# Patient Record
Sex: Male | Born: 1995 | Race: White | Hispanic: No | Marital: Single | Smoking: Never smoker
Health system: Southern US, Community
[De-identification: ages and names within clinical notes are randomized; demographics above are authoritative.]

## PROBLEM LIST (undated history)

## (undated) DIAGNOSIS — F419 Anxiety disorder, unspecified: Secondary | ICD-10-CM

## (undated) DIAGNOSIS — F840 Autistic disorder: Secondary | ICD-10-CM

## (undated) DIAGNOSIS — R6250 Unspecified lack of expected normal physiological development in childhood: Secondary | ICD-10-CM

## (undated) DIAGNOSIS — R4689 Other symptoms and signs involving appearance and behavior: Secondary | ICD-10-CM

## (undated) DIAGNOSIS — F909 Attention-deficit hyperactivity disorder, unspecified type: Secondary | ICD-10-CM

## (undated) DIAGNOSIS — F819 Developmental disorder of scholastic skills, unspecified: Secondary | ICD-10-CM

## (undated) DIAGNOSIS — R4189 Other symptoms and signs involving cognitive functions and awareness: Secondary | ICD-10-CM

## (undated) DIAGNOSIS — I10 Essential (primary) hypertension: Secondary | ICD-10-CM

## (undated) HISTORY — DX: Autistic disorder: F84.0

## (undated) HISTORY — DX: Developmental disorder of scholastic skills, unspecified: F81.9

## (undated) HISTORY — DX: Anxiety disorder, unspecified: F41.9

## (undated) HISTORY — DX: Essential (primary) hypertension: I10

---
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
## 2019-07-12 ENCOUNTER — Encounter (HOSPITAL_COMMUNITY): Payer: Self-pay

## 2019-07-12 ENCOUNTER — Emergency Department (HOSPITAL_COMMUNITY)
Admission: EM | Admit: 2019-07-12 | Discharge: 2019-07-17 | Disposition: A | Discharge status: Home / Self Care | Payer: Medicaid Other | Attending: Emergency Medicine | Admitting: Emergency Medicine

## 2019-07-12 DIAGNOSIS — F919 Conduct disorder, unspecified: Secondary | ICD-10-CM | POA: Diagnosis not present

## 2019-07-12 DIAGNOSIS — F840 Autistic disorder: Secondary | ICD-10-CM | POA: Insufficient documentation

## 2019-07-12 DIAGNOSIS — Z79899 Other long term (current) drug therapy: Secondary | ICD-10-CM | POA: Diagnosis not present

## 2019-07-12 DIAGNOSIS — F909 Attention-deficit hyperactivity disorder, unspecified type: Secondary | ICD-10-CM | POA: Insufficient documentation

## 2019-07-12 DIAGNOSIS — R4689 Other symptoms and signs involving appearance and behavior: Secondary | ICD-10-CM

## 2019-07-12 HISTORY — DX: Autistic disorder: F84.0

## 2019-07-12 HISTORY — DX: Other symptoms and signs involving appearance and behavior: R46.89

## 2019-07-12 HISTORY — DX: Unspecified lack of expected normal physiological development in childhood: R62.50

## 2019-07-12 HISTORY — DX: Other symptoms and signs involving cognitive functions and awareness: R41.89

## 2019-07-12 LAB — COMPREHENSIVE METABOLIC PANEL
ALT: 81 U/L — ABNORMAL HIGH (ref 0–44)
AST: 48 U/L — ABNORMAL HIGH (ref 15–41)
Albumin: 5 g/dL (ref 3.5–5.0)
Alkaline Phosphatase: 65 U/L (ref 38–126)
Anion gap: 11 (ref 5–15)
BUN: 17 mg/dL (ref 6–20)
CO2: 24 mmol/L (ref 22–32)
Calcium: 9.8 mg/dL (ref 8.9–10.3)
Chloride: 105 mmol/L (ref 98–111)
Creatinine, Ser: 1.08 mg/dL (ref 0.61–1.24)
GFR calc Af Amer: >60 mL/min (ref >60)
GFR calc non Af Amer: >60 mL/min (ref >60)
Glucose, Bld: 102 mg/dL — ABNORMAL HIGH (ref 70–99)
Potassium: 3.9 mmol/L (ref 3.5–5.1)
Sodium: 140 mmol/L (ref 135–145)
Total Bilirubin: 0.4 mg/dL (ref 0.3–1.2)
Total Protein: 8.3 g/dL — ABNORMAL HIGH (ref 6.5–8.1)

## 2019-07-12 LAB — CBC
HCT: 45.9 % (ref 39.0–52.0)
Hemoglobin: 15.9 g/dL (ref 13.0–17.0)
MCH: 32.3 pg (ref 26.0–34.0)
MCHC: 34.6 g/dL (ref 30.0–36.0)
MCV: 93.3 fL (ref 80.0–100.0)
Platelets: 281 10*3/uL (ref 150–400)
RBC: 4.92 MIL/uL (ref 4.22–5.81)
RDW: 13.2 % (ref 11.5–15.5)
WBC: 8.1 10*3/uL (ref 4.0–10.5)
nRBC: 0 % (ref 0.0–0.2)

## 2019-07-12 LAB — URINALYSIS, ROUTINE W REFLEX MICROSCOPIC
Bilirubin Urine: NEGATIVE
Glucose, UA: NEGATIVE mg/dL
Hgb urine dipstick: NEGATIVE
Ketones, ur: NEGATIVE mg/dL
Leukocytes,Ua: NEGATIVE
Nitrite: NEGATIVE
Protein, ur: NEGATIVE mg/dL
Specific Gravity, Urine: 1.014 (ref 1.005–1.030)
pH: 7 (ref 5.0–8.0)

## 2019-07-12 LAB — RAPID URINE DRUG SCREEN, HOSP PERFORMED
Amphetamines: NOT DETECTED
Barbiturates: NOT DETECTED
Benzodiazepines: POSITIVE — AB
Cocaine: NOT DETECTED
Opiates: NOT DETECTED
Tetrahydrocannabinol: NOT DETECTED

## 2019-07-12 LAB — ETHANOL: Alcohol, Ethyl (B): <10 mg/dL (ref <10)

## 2019-07-12 LAB — ACETAMINOPHEN LEVEL: Acetaminophen (Tylenol), Serum: <10 ug/mL — ABNORMAL LOW (ref 10–30)

## 2019-07-12 LAB — SALICYLATE LEVEL: Salicylate Lvl: <7 mg/dL (ref 2.8–30.0)

## 2019-07-12 MED ORDER — ALPRAZOLAM 0.5 MG PO TABS
0.5000 mg | ORAL_TABLET | Freq: Every day | ORAL | Status: DC
  Administered 2019-07-12 – 2019-07-16 (×5): 0.5 mg via ORAL
  Filled 2019-07-12 (×6): qty 1

## 2019-07-12 MED ORDER — ONDANSETRON HCL 4 MG PO TABS
4.0000 mg | ORAL_TABLET | Freq: Three times a day (TID) | ORAL | Status: DC | PRN
  Administered 2019-07-15: 4 mg via ORAL
  Filled 2019-07-12: qty 1

## 2019-07-12 MED ORDER — ALUM & MAG HYDROXIDE-SIMETH 200-200-20 MG/5ML PO SUSP: 30.0000 mL | Freq: Four times a day (QID) | ORAL | Status: DC | PRN

## 2019-07-12 MED ORDER — PROPRANOLOL HCL ER BEADS 80 MG PO CP24
80.0000 mg | ORAL_CAPSULE | Freq: Every day | ORAL | Status: DC
  Administered 2019-07-15 (×2): 80 mg via ORAL
  Filled 2019-07-12 (×6): qty 1

## 2019-07-12 MED ORDER — ACETAMINOPHEN 325 MG PO TABS: 650.0000 mg | ORAL_TABLET | ORAL | Status: DC | PRN

## 2019-07-12 NOTE — ED Notes (Signed)
[facility] called and states pt is psych cleared.  
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
## 2019-07-12 NOTE — Progress Notes (Signed)
CSW in attempt to contact pts guardian without succuss. PH: [phone]. CSW left VM requesting call back. 2nd shift CSW will leave handoff for 1st shift social worker to follow up.   [facility]  Clinical Social Worker  Ph: [phone]
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
## 2019-07-12 NOTE — ED Notes (Signed)
Guardian phone number [phone]

## 2019-07-12 NOTE — ED Notes (Signed)
SW notified at this time regarding [facility] recommendation

## 2019-07-12 NOTE — [facility] Counselor (Signed)
Disposition: Per Letitia Libra, FNP patient does not meet in patient criteria. She recommends patient be referred to social work for placement.

## 2019-07-12 NOTE — Progress Notes (Signed)
Consult request has been received. CSW attempting to follow up at present time  Debbera Wolken M. Medora Roorda LCSWA Transitions of Care  Clinical Social Worker  Ph: [phone] 
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
## 2019-07-13 NOTE — ED Notes (Addendum)
Talked on the phone with patient's mother. Received permission to inform her that patient would not be discharged.

## 2019-07-14 NOTE — ED Notes (Signed)
Meal provided 

## 2019-07-14 NOTE — ED Notes (Signed)
Pt asks when he will be placed  Appears resigned that he will be here for a bit more time   "It takes awhile sometimes"

## 2019-07-14 NOTE — ED Notes (Signed)
Psych cleared   Awaiting placement by SW  Pt continues to have sitter due to autism and impulsivity

## 2019-07-14 NOTE — ED Notes (Signed)
Pt dropped Xanax 0.5 mg on floor   Pill retrieved and wasted in sink with Baldo Ash, RN witness  Call to [facility] spoke with pharm who reentered and med delivered to pt   His lights are dimmed and he is ready to sleep

## 2019-07-16 MED ORDER — PROPRANOLOL HCL ER 80 MG PO CP24
80.0000 mg | ORAL_CAPSULE | Freq: Every day | ORAL | Status: DC
  Administered 2019-07-16: 22:00:00 80 mg via ORAL
  Filled 2019-07-16 (×2): qty 1
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
## 2019-07-17 NOTE — Clinical Social Work Note (Signed)
Received call Ms Peter Bradshaw with DSS stating that she and guardian, Mr Pete Glatter, were coming to ED to talk about expectations of return home, after which pt would return home with guardian.  Meeting held.  Patient verbalized understanding.  D/c with Mr hemrick.  DSS will continue to follow.

## 2019-07-17 NOTE — ED Provider Notes (Signed)
Emergency Medicine Observation Re-evaluation Note  Peter Bradshaw is a 23 y.o. male, seen on rounds today.  Pt initially presented to the ED for complaints of V70.1 and Psychiatric Evaluation Currently, the patient is cooperative.  Physical Exam  BP 113/87 (BP Location: Left Arm)   Pulse 74   Temp 97.9 F (36.6 C) (Oral)   Resp 18   SpO2 96%  Physical Exam  ED Course / MDM  EKG:    I have reviewed the labs performed to date as well as medications administered while in observation.  Recent changes in the last 24 hours include none. Plan  Current plan is for discharge back to guardian. Patient was under IVC and has been rescinded.   Peter Rasmussen, MD 07/17/19 [phone]

## 2020-05-21 ENCOUNTER — Other Ambulatory Visit: Payer: Self-pay

## 2020-05-21 ENCOUNTER — Emergency Department (HOSPITAL_COMMUNITY)
Admission: EM | Admit: 2020-05-21 | Discharge: 2020-05-23 | Disposition: A | Discharge status: Home / Self Care | Payer: Medicaid Other | Attending: Emergency Medicine | Admitting: Emergency Medicine

## 2020-05-21 ENCOUNTER — Encounter (HOSPITAL_COMMUNITY): Payer: Self-pay

## 2020-05-21 DIAGNOSIS — I82220 Acute embolism and thrombosis of inferior vena cava: Secondary | ICD-10-CM | POA: Insufficient documentation

## 2020-05-21 DIAGNOSIS — F840 Autistic disorder: Secondary | ICD-10-CM | POA: Diagnosis not present

## 2020-05-21 DIAGNOSIS — Z20822 Contact with and (suspected) exposure to covid-19: Secondary | ICD-10-CM | POA: Diagnosis not present

## 2020-05-21 DIAGNOSIS — F909 Attention-deficit hyperactivity disorder, unspecified type: Secondary | ICD-10-CM | POA: Insufficient documentation

## 2020-05-21 DIAGNOSIS — Z046 Encounter for general psychiatric examination, requested by authority: Secondary | ICD-10-CM | POA: Diagnosis not present

## 2020-05-21 LAB — CBC WITH DIFFERENTIAL/PLATELET
Abs Immature Granulocytes: 0.08 10*3/uL — ABNORMAL HIGH (ref 0.00–0.07)
Basophils Absolute: 0.1 10*3/uL (ref 0.0–0.1)
Basophils Relative: 1 %
Eosinophils Absolute: 0.3 10*3/uL (ref 0.0–0.5)
Eosinophils Relative: 3 %
HCT: 45.4 % (ref 39.0–52.0)
Hemoglobin: 15.6 g/dL (ref 13.0–17.0)
Immature Granulocytes: 1 %
Lymphocytes Relative: 18 %
Lymphs Abs: 1.9 10*3/uL (ref 0.7–4.0)
MCH: 32.6 pg (ref 26.0–34.0)
MCHC: 34.4 g/dL (ref 30.0–36.0)
MCV: 94.8 fL (ref 80.0–100.0)
Monocytes Absolute: 1.1 10*3/uL — ABNORMAL HIGH (ref 0.1–1.0)
Monocytes Relative: 10 %
Neutro Abs: 7.5 10*3/uL (ref 1.7–7.7)
Neutrophils Relative %: 67 %
Platelets: 325 10*3/uL (ref 150–400)
RBC: 4.79 MIL/uL (ref 4.22–5.81)
RDW: 12.7 % (ref 11.5–15.5)
WBC: 11 10*3/uL — ABNORMAL HIGH (ref 4.0–10.5)
nRBC: 0 % (ref 0.0–0.2)

## 2020-05-21 LAB — COMPREHENSIVE METABOLIC PANEL
ALT: 62 U/L — ABNORMAL HIGH (ref 0–44)
AST: 39 U/L (ref 15–41)
Albumin: 4.9 g/dL (ref 3.5–5.0)
Alkaline Phosphatase: 56 U/L (ref 38–126)
Anion gap: 11 (ref 5–15)
BUN: 21 mg/dL — ABNORMAL HIGH (ref 6–20)
CO2: 23 mmol/L (ref 22–32)
Calcium: 9.9 mg/dL (ref 8.9–10.3)
Chloride: 103 mmol/L (ref 98–111)
Creatinine, Ser: 0.94 mg/dL (ref 0.61–1.24)
GFR calc Af Amer: >60 mL/min (ref >60)
GFR calc non Af Amer: >60 mL/min (ref >60)
Glucose, Bld: 108 mg/dL — ABNORMAL HIGH (ref 70–99)
Potassium: 3.9 mmol/L (ref 3.5–5.1)
Sodium: 137 mmol/L (ref 135–145)
Total Bilirubin: 0.5 mg/dL (ref 0.3–1.2)
Total Protein: 8.5 g/dL — ABNORMAL HIGH (ref 6.5–8.1)

## 2020-05-21 LAB — ETHANOL: Alcohol, Ethyl (B): <10 mg/dL (ref <10)

## 2020-05-21 LAB — SALICYLATE LEVEL: Salicylate Lvl: <7 mg/dL — ABNORMAL LOW (ref 7.0–30.0)

## 2020-05-21 LAB — RAPID URINE DRUG SCREEN, HOSP PERFORMED
Amphetamines: NOT DETECTED
Barbiturates: NOT DETECTED
Benzodiazepines: POSITIVE — AB
Cocaine: NOT DETECTED
Opiates: NOT DETECTED
Tetrahydrocannabinol: NOT DETECTED

## 2020-05-21 LAB — ACETAMINOPHEN LEVEL: Acetaminophen (Tylenol), Serum: <10 ug/mL — ABNORMAL LOW (ref 10–30)

## 2020-05-21 LAB — SARS CORONAVIRUS 2 BY RT PCR (HOSPITAL ORDER, PERFORMED IN ~~LOC~~ HOSPITAL LAB): SARS Coronavirus 2: NEGATIVE

## 2020-05-21 MED ORDER — ACETAMINOPHEN 325 MG PO TABS
650.0000 mg | ORAL_TABLET | ORAL | Status: DC | PRN
  Administered 2020-05-21: 650 mg via ORAL
  Filled 2020-05-21: qty 2

## 2020-05-21 MED ORDER — RISPERIDONE 1 MG PO TABS
0.5000 mg | ORAL_TABLET | Freq: Two times a day (BID) | ORAL | Status: DC
  Administered 2020-05-21 – 2020-05-22 (×3): 0.5 mg via ORAL
  Filled 2020-05-21 (×3): qty 1

## 2020-05-21 MED ORDER — ZOLPIDEM TARTRATE 5 MG PO TABS: 5.0000 mg | ORAL_TABLET | Freq: Every evening | ORAL | Status: DC | PRN

## 2020-05-21 MED ORDER — ONDANSETRON HCL 4 MG PO TABS
4.0000 mg | ORAL_TABLET | Freq: Three times a day (TID) | ORAL | Status: DC | PRN
  Administered 2020-05-21: 4 mg via ORAL
  Filled 2020-05-21: qty 1

## 2020-05-21 MED ORDER — ALUM & MAG HYDROXIDE-SIMETH 200-200-20 MG/5ML PO SUSP: 30.0000 mL | Freq: Four times a day (QID) | ORAL | Status: DC | PRN

## 2020-05-21 MED ORDER — ALPRAZOLAM 0.5 MG PO TABS
0.2500 mg | ORAL_TABLET | Freq: Three times a day (TID) | ORAL | Status: DC | PRN
  Administered 2020-05-21: 0.25 mg via ORAL
  Filled 2020-05-21: qty 1

## 2020-05-21 MED ORDER — PROPRANOLOL HCL ER 80 MG PO CP24
80.0000 mg | ORAL_CAPSULE | Freq: Every day | ORAL | Status: DC
  Administered 2020-05-21 – 2020-05-22 (×2): 80 mg via ORAL
  Filled 2020-05-21 (×6): qty 1
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
## 2020-05-22 NOTE — Progress Notes (Signed)
Patient ID: BRONISLAUS VERDELL, male   DOB: 04/08/1996, 24 y.o.   MRN: [id number]   I spoke to [facility] representative Danetta regarding patient. Per counselor, representative was suppose to call today to provide an update on patients plan of care. It was documented that Daneta was to research potential placement options and notify [facility] with updates. When I spoke to [place] today, she stated what she meant by that was that she would look into places that may be optional housing however, she added, " this is a process and will take time." She stated that patients case manager has been on vacation since July, 12th and will not return until Monday. She was advised that patient did not meet criteria for inpatient psychiatric hospitalization and had therefore been psychiatrically cleared meaning that it would be expected for him to be  discharged from the ED. She stated that as far as she knew, patients case manager was looking for alternative housing options due to patients aggressive behaviors towards his guardian but she had no updates. Stated today, there would be no alternative place that patient could go as there are no emergency  placements available. Stated that she would call patients guardian to see if he could return home. She was advised to notify [facility] once she has spoken to patients guardian to move forward with patients plan of care. Again, on a psychiatric standpoint, he is psychiatrically cleared.
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
## 2020-05-22 NOTE — ED Notes (Signed)
Pt speaking with TTS currently in room

## 2020-05-22 NOTE — ED Notes (Signed)
Breakfast tray given to pt - finger foods only. Pt currently up watching tv and eating. Pleasant and cooperative.

## 2020-05-22 NOTE — ED Notes (Signed)
Pt given ph to call guardian per pt request

## 2020-05-22 NOTE — Progress Notes (Signed)
Patient ID: KHIAN REMO, male   DOB: 05/04/1996, 24 y.o.   MRN: [id number]   I again spoke back to [facility] representative Danetta who stated she contacted patients guardian who stated that patient could not return. She stated," what exactly would you'll like [facility] to do? At this point, we have no place for him to go so the only thing that we can do is touch basis with you'll daily and update you when an alternative placement is found." She was again advised that once a patient is psychiatrically cleared, discharge is expected. She was advised that patient would not be able to stay in the ED until placement is found as she previously stated, this could be a process.  She stated that they would begin to search for placement. She was strongly encouraged to search for emergency placements as well. She was advised that social worker will be contacting her tomorrow to discuss patients plan of care.

## 2020-05-22 NOTE — Progress Notes (Signed)
Pt has been psychiatrically cleared. CSW left HIPAA compliant voice message with [facility] case manager, Danetta [phone]) requesting a return phone call to discuss disposition.   Wells Guiles, LCSW, LCAS Disposition CSW [facility] [phone]

## 2020-05-23 NOTE — ED Notes (Signed)
Spoke with EDP IVC paperwork rescinded and faxed to magistrate at this time.

## 2020-05-23 NOTE — Clinical Social Work Note (Signed)
Transition of Care [facility]) - Emergency Department Mini Assessment  Patient Details  Name: Peter Bradshaw MRN: [id number] Date of Birth: August 11, 1996  [facility] of [facility]) CM/SW Contact:    Ewing Schlein, LCSW Phone Number: 05/23/2020, 10:08 AM  Clinical Narrative: Patient is a 24 year old male with autism that was brought to the ED under IVC. CSW notified by [facility] the patient is psych cleared.  CSW called patient's [facility] case worker, Flossie Dibble, to explain the patient has been psych and medically cleared so he will need to return home while Shelly Coss continues to work on placement. Danetta verbalized understanding. CSW attempted to call patient's legal guardian, [name], but was only able to reach voicemail. CSW left HIPAA compliant voicemail regarding the patient returning home.  CSW called [place] PD and spoke with Cardell Peach regarding the patient needing to be picked up. Cardell Peach stated he dropped off the patient for his IVC and will pick him up today. CSW updated ED. TOC signing off.  ED Mini Assessment: What brought you to the Emergency Department? : IVC Barriers to Discharge: ED Awaiting Psych Clearance, ED Psych evaluation, ED Facility/Family Refusing to Allow Patient to Return Barrier interventions: TTS consult; [place] PD called to pick up patient Means of departure: Police Interventions which prevented an admission or readmission: Other (must enter comment) [facility] assessed patient and he is psych cleared)  Patient Contact and Communications Key Contact 1: [place] [place]) Key Contact 2: [place] PD Cardell Peach) Contact Date: 05/23/20    Call outcome: Patient will return home while Shelly Coss continues to work on placement  Admission diagnosis:  IVC Patient Active Problem List   Diagnosis Date Noted  . Autism spectrum disorder    PCP:  Elisabeth Most, FNP Pharmacy:   [facility] [place] - [address] [place] [address] [address] [place] [address] Phone: [phone] Fax: [phone]

## 2020-05-23 NOTE — ED Provider Notes (Signed)
Emergency Medicine Observation Re-evaluation Note  Peter Bradshaw is a 24 y.o. male, seen on rounds today.  Pt initially presented to the ED for complaints of V70.1 and Aggressive Behavior Currently, the patient is awaiting placement in the ED.  Physical Exam  BP 117/79 (BP Location: Left Arm)   Pulse 88   Temp 97.9 F (36.6 C) (Oral)   Resp 20   Ht 5\' 4"  (1.626 m)   Wt 97.5 kg   SpO2 96%   BMI 36.90 kg/m  Physical Exam  Calm and cooperative.  Even unlabored respirations.   ED Course / MDM  EKG:EKG Interpretation  Date/Time:  Wednesday May 21 2020 16:39:28 EDT Ventricular Rate:  95 PR Interval:  162 QRS Duration: 100 QT Interval:  354 QTC Calculation: 444 R Axis:   -18 Text Interpretation: Normal sinus rhythm Minimal voltage criteria for LVH, may be normal variant ( R in aVL ) ST elevation, consider early repolarization, pericarditis, or injury Abnormal ECG When compared with ECG of EARLIER SAME DATE No significant change was found Confirmed by [id number] (Dione Booze) on 05/21/2020 5:16:15 PM  Clinical Course as of May 23 726  Wed May 21, 2020  [id number] 24 year old male with history of autism lives at home with caregiver but has been increasingly more violent over the past few months.  He is currently under an IVC.  He is calm at the moment.  Getting screening labs and will get a TTS consult   [MB]    Clinical Course User Index [MB] 21, MD   I have reviewed the labs performed to date as well as medications administered while in observation.  Recent changes in the last 24 hours include N/A. Plan  Current plan is for placement from the ED. Patient is under full IVC at this time.  10:00 AM  Patient has been psych cleared after reevaluation this morning.  IVC rescinded.  Social worker consulted he was reached out to the patient's guardian. He will be discharged to their care this AM. They are on their way to pick up the patient. No aggressive behavior noted this AM.  Patient is calm and cooperative.    Terrilee Files, MD 05/23/20 1007

## 2020-05-23 NOTE — Discharge Instructions (Signed)
Please follow with your primary care doctor as soon as you are able.  Return to the emergency department with any new or suddenly worsening symptoms.

## 2020-07-09 ENCOUNTER — Ambulatory Visit: Payer: Medicaid Other | Admitting: Family Medicine

## 2020-07-09 ENCOUNTER — Encounter: Payer: Self-pay | Admitting: Family Medicine

## 2020-07-09 ENCOUNTER — Other Ambulatory Visit: Payer: Self-pay

## 2020-07-09 VITALS — BP 129/84 | HR 103 | Temp 96.7°F | Ht 64.0 in | Wt 217.8 lb

## 2020-07-09 DIAGNOSIS — F411 Generalized anxiety disorder: Secondary | ICD-10-CM

## 2020-07-09 DIAGNOSIS — F332 Major depressive disorder, recurrent severe without psychotic features: Secondary | ICD-10-CM | POA: Insufficient documentation

## 2020-07-09 MED ORDER — ESCITALOPRAM OXALATE 10 MG PO TABS: 10.0000 mg | ORAL_TABLET | Freq: Every day | ORAL | 2 refills | Status: DC

## 2020-07-09 NOTE — Progress Notes (Signed)
New Patient Office Visit  Assessment & Plan:  1-2. 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 Started patient on Lexapro 10 mg QD today. I did not refill Xanax as patient has already been without it for a week.  - escitalopram (LEXAPRO) 10 MG tablet; Take 1 tablet (10 mg total) by mouth daily.  Dispense: 30 tablet; Refill: 2   Follow-up: Return in about 6 weeks (around 08/20/2020) for anxiety/depression.   Deliah Boston, MSN, APRN, FNP-C [facility]  Subjective:  Patient ID: Peter Bradshaw, male    DOB: 01/09/1996  Age: 24 y.o. MRN: [id number]  Patient Care Team: Gwenlyn Fudge, FNP as PCP - General (Family Medicine)  CC:  Chief Complaint  Patient presents with  . New Patient (Initial Visit)    [facility]   . Establish Care  . Anxiety    Patient is currently only on xanax for anxiety.    HPI Peter Bradshaw presents to establish care. He is transferring care from [facility] due to a move.   He is accompanied by his guardian today, who he is okay with being present.  Patient has been taking Xanax 0.25 mg TID for anxiety. He is not on any other medications to treat anxiety. He ran out of medication a week ago.   GAD 7 : Generalized Anxiety Score 07/09/2020  Nervous, Anxious, on Edge 3  Control/stop worrying 3  Worry too much - different things 2  Trouble relaxing 3  Restless 3  Easily annoyed or irritable 2  Afraid - awful might happen 3  Total GAD 7 Score 19   Depression screen [facility] 2/9 07/09/2020 05/21/2020  Decreased Interest 3 0  Down, Depressed, Hopeless 2 0  PHQ - 2 Score 5 0  Altered sleeping 1 -  Tired, decreased energy 3 -  Change in appetite 3 -  Feeling bad or failure about yourself  2 -  Trouble concentrating 3 -  Moving slowly or fidgety/restless 2 -  Suicidal thoughts 2 -  PHQ-9 Score 21 -   Patient reports he has  thoughts he would be better off dead but denies any thoughts of harming himself.    Review of Systems  Constitutional: Negative for chills, fever, malaise/fatigue and weight loss.  HENT: Negative for congestion, ear discharge, ear pain, nosebleeds, sinus pain, sore throat and tinnitus.   Eyes: Negative for blurred vision, double vision, pain, discharge and redness.  Respiratory: Negative for cough, shortness of breath and wheezing.   Cardiovascular: Negative for chest pain, palpitations and leg swelling.  Gastrointestinal: Negative for abdominal pain, constipation, diarrhea, heartburn, nausea and vomiting.  Genitourinary: Negative for dysuria, frequency and urgency.  Musculoskeletal: Negative for myalgias.  Skin: Negative for rash.  Neurological: Negative for dizziness, seizures, weakness and headaches.  Psychiatric/Behavioral: Positive for depression. Negative for substance abuse and suicidal ideas. The patient is nervous/anxious.     Current Outpatient Medications:  .  ALPRAZolam (XANAX) 0.25 MG tablet, Take 0.25 mg by mouth 3 (three) times daily as needed., Disp: , Rfl:  .  Multiple Vitamins-Minerals (MULTIVITAMIN WITH MINERALS) tablet, Take 1 tablet by mouth daily., Disp: , Rfl:  .  propranolol ER (INDERAL LA) 80 MG 24 hr capsule, Take 80 mg by mouth daily., Disp: , Rfl:   No Known Allergies  Past Medical History:  Diagnosis Date  . ADHD (attention deficit hyperactivity disorder)   . Anxiety   .  Autism   . Autistic behavior   . Cognitive impairment   . Developmental delay   . Hypertension   . Mental developmental delay     History reviewed. No pertinent surgical history.  Family History  Problem Relation Age of Onset  . Depression Mother   . Obesity Father   . Heart Problems Father   . Depression Maternal Grandmother     Social History   Socioeconomic History  . Marital status: Single    Spouse name: Not on file  . Number of children: Not on file  . Years of  education: Not on file  . Highest education level: Not on file  Occupational History  . Not on file  Tobacco Use  . Smoking status: Never Smoker  . Smokeless tobacco: Never Used  Vaping Use  . Vaping Use: Never used  Substance and Sexual Activity  . Alcohol use: Never  . Drug use: Never  . Sexual activity: Never  Other Topics Concern  . Not on file  Social History Narrative  . Not on file   Social Determinants of Health   Financial Resource Strain:   . Difficulty of Paying Living Expenses: Not on file  Food Insecurity:   . Worried About Programme researcher, broadcasting/film/video in the Last Year: Not on file  . Ran Out of Food in the Last Year: Not on file  Transportation Needs:   . Lack of Transportation (Medical): Not on file  . Lack of Transportation (Non-Medical): Not on file  Physical Activity:   . Days of Exercise per Week: Not on file  . Minutes of Exercise per Session: Not on file  Stress:   . Feeling of Stress : Not on file  Social Connections:   . Frequency of Communication with Friends and Family: Not on file  . Frequency of Social Gatherings with Friends and Family: Not on file  . Attends Religious Services: Not on file  . Active Member of Clubs or Organizations: Not on file  . Attends Banker Meetings: Not on file  . Marital Status: Not on file  Intimate Partner Violence:   . Fear of Current or Ex-Partner: Not on file  . Emotionally Abused: Not on file  . Physically Abused: Not on file  . Sexually Abused: Not on file    Objective:   Today's Vitals: BP 129/84   Pulse (!) 103   Temp (!) 96.7 F (35.9 C) (Temporal)   Ht 5\' 4"  (1.626 m)   Wt 217 lb 12.8 oz (98.8 kg)   SpO2 96%   BMI 37.39 kg/m   Physical Exam Vitals reviewed.  Constitutional:      General: He is not in acute distress.    Appearance: Normal appearance. He is obese. He is not ill-appearing, toxic-appearing or diaphoretic.  HENT:     Head: Normocephalic and atraumatic.  Eyes:      General: No scleral icterus.       Right eye: No discharge.        Left eye: No discharge.     Conjunctiva/sclera: Conjunctivae normal.  Cardiovascular:     Rate and Rhythm: Normal rate and regular rhythm.     Heart sounds: Normal heart sounds. No murmur heard.  No friction rub. No gallop.   Pulmonary:     Effort: Pulmonary effort is normal. No respiratory distress.     Breath sounds: Normal breath sounds. No stridor. No wheezing, rhonchi or rales.  Abdominal:  General: Abdomen is flat. Bowel sounds are normal. There is no distension.     Palpations: Abdomen is soft. There is no mass.     Tenderness: There is no abdominal tenderness. There is no guarding or rebound.     Hernia: No hernia is present.  Musculoskeletal:        General: Normal range of motion.     Cervical back: Normal range of motion.  Skin:    General: Skin is warm and dry.  Neurological:     Mental Status: He is alert and oriented to person, place, and time. Mental status is at baseline.  Psychiatric:        Mood and Affect: Mood normal.        Behavior: Behavior normal.        Thought Content: Thought content normal.        Judgment: Judgment normal.

## 2020-07-15 ENCOUNTER — Encounter: Payer: Self-pay | Admitting: *Deleted

## 2020-08-19 ENCOUNTER — Encounter: Payer: Self-pay | Admitting: Nurse Practitioner

## 2020-08-19 ENCOUNTER — Ambulatory Visit (INDEPENDENT_AMBULATORY_CARE_PROVIDER_SITE_OTHER): Payer: Medicaid Other | Admitting: Nurse Practitioner

## 2020-08-19 ENCOUNTER — Other Ambulatory Visit: Payer: Self-pay

## 2020-08-19 VITALS — BP 137/87 | HR 109 | Temp 97.0°F | Ht 64.0 in | Wt 217.6 lb

## 2020-08-19 DIAGNOSIS — F332 Major depressive disorder, recurrent severe without psychotic features: Secondary | ICD-10-CM

## 2020-08-19 NOTE — Patient Instructions (Signed)
No changes to current medication therapy, patient;s anxiety and depression improved. Worsening anger issues, suggested  Psychiatry evaluation and cognitive behavioral therapy, follow up in 3 months.   Intermittent Explosive Disorder Intermittent explosive disorder (IED) is a mental health disorder in which a person has trouble controlling his or her angry impulses. A person with this disorder may repeatedly have sudden episodes of aggression (explosive rages). The rages are unplanned and too much for the situation. They can result in serious injury to people or animals, or property damage. IED may interfere with relationships, school, or work, and may cause legal or financial problems. Intermittent explosive disorder most commonly starts in childhood or the teenage years. It may go away after 10-20 years or may last for life. People with IED often have one or more other mental disorders, such as other impulse control disorders, depression, anxiety, or substance abuse. People with this disorder are also at higher risk for self-injury and suicide attempts. What are the causes? The cause of IED is not known. What increases the risk? You are more likely to develop this condition if:  You are male.  You were abused during childhood.  You have a family member who has IED, a different impulse control disorder, or other mental health issues. What are the signs or symptoms? Explosive rages may be verbal, physical, or both. Examples of verbal rages may include:  Temper tantrums after the age of 6 years.  Verbal arguments or fights.  Vicious name calling, cursing, or insulting others.  Road rage. Physical rages are directed at people, animals, or property. They may or may not cause injury or damage. Examples of physical aggression may include:  Pushing, slapping, punching, choking, or kicking.  Punching holes in walls, breaking furniture, or throwing items. Verbal or physical rages:  May  happen an average of twice a week for 3 months or longer. Rages causing physical injury or property damage may occur at least three times a year.  Usually last less than 30 minutes. People with IED may want to change their behavior but are often unable to monitor and control their outbursts by themselves. You may feel remorseful or embarrassed after a rage. In between rages, there may be no signs of anger, or you may be irritable. Before or during a rage, you may have one or more of the following sensations:  Tingling.  Shaking (tremors).  Pounding heart.  Chest tightness.  Head pressure.  Hearing an echo. How is this diagnosed? Intermittent explosive disorder is diagnosed through an assessment by your health care provider. He or she may ask about:  Your rages and how they affect your life.  Your moods, thoughts, and other behaviors.  Your medical history.  Your use of medicines or drugs. Your health care provider may do a physical exam and order lab tests or brain imaging tests. You may be evaluated by a mental health professional. How is this treated? This condition is treated by mental health specialists. Treatment may include:  Therapy. You may have therapy that: ? Focuses on your underlying feelings and reasons that lead to rages. ? Evaluates your triggers for rages and ways to prevent or control them (cognitive behavioral therapy, CBT). CBT in a group setting may be especially helpful.  Support groups. These can provide emotional support, advice, and guidance.  Medicine to help reduce the frequency and severity of explosive rages. There are several types of medicines that may be used, including certain antidepressants, mood stabilizers, anti-seizure medicines (  anticonvulsants), major tranquilizers (antipsychotics), and beta blockers. The most effective treatment is usually a combination of therapy, support groups, and medicine. You will also be treated for any other mental  health problems you have. Follow these instructions at home:   Take over-the-counter and prescription medicines only as told by your health care provider.  Do not start taking any new over-the-counter or prescription medicines before first getting approval from your health care provider.  Attend therapy and support groups as recommended.  Try to avoid situations and people that upset you.  Keep all follow-up visits as told by your health care provider. This is important. Contact a health care provider if you:  Have worsening symptoms.  Are not able to take your medicine as told. Get help right away if you have:  Thoughts about hurting yourself or someone else.  Seriously injured yourself or someone else. If you ever feel like you may hurt yourself or others, or have thoughts about taking your own life, get help right away. You can go to your nearest emergency department or call:  Your local emergency services (911 in the [place]  A suicide crisis helpline, such as the National Suicide Prevention Lifeline at 615-137-2662. This is open 24 hours a day. Summary  Intermittent explosive disorder (IED) is a mental health disorder in which a person has trouble controlling his or her angry impulses.  Intermittent explosive disorder most commonly starts in childhood or the teenage years.  People with IED may want to change their behavior but are often unable to monitor and control their outbursts by themselves.  The most effective treatment is usually a combination of therapy, support groups, and medicine. Treatment will also focus on any other mental health problems you have. This information is not intended to replace advice given to you by your health care provider. Make sure you discuss any questions you have with your health care provider. Document Revised: 09/16/2017 Document Reviewed: 08/11/2017 Elsevier Patient Education  2020 [employer] Generalized Anxiety Disorder,  Adult Generalized anxiety disorder (GAD) is a mental health disorder. People with this condition constantly worry about everyday events. Unlike normal anxiety, worry related to GAD is not triggered by a specific event. These worries also do not fade or get better with time. GAD interferes with life functions, including relationships, work, and school. GAD can vary from mild to severe. People with severe GAD can have intense waves of anxiety with physical symptoms (panic attacks). What are the causes? The exact cause of GAD is not known. What increases the risk? This condition is more likely to develop in:  Women.  People who have a family history of anxiety disorders.  People who are very shy.  People who experience very stressful life events, such as the death of a loved one.  People who have a very stressful family environment. What are the signs or symptoms? People with GAD often worry excessively about many things in their lives, such as their health and family. They may also be overly concerned about:  Doing well at work.  Being on time.  Natural disasters.  Friendships. Physical symptoms of GAD include:  Fatigue.  Muscle tension or having muscle twitches.  Trembling or feeling shaky.  Being easily startled.  Feeling like your heart is pounding or racing.  Feeling out of breath or like you cannot take a deep breath.  Having trouble falling asleep or staying asleep.  Sweating.  Nausea, diarrhea, or irritable bowel syndrome (IBS).  Headaches.  Trouble  concentrating or remembering facts.  Restlessness.  Irritability. How is this diagnosed? Your health care provider can diagnose GAD based on your symptoms and medical history. You will also have a physical exam. The health care provider will ask specific questions about your symptoms, including how severe they are, when they started, and if they come and go. Your health care provider may ask you about your use  of alcohol or drugs, including prescription medicines. Your health care provider may refer you to a mental health specialist for further evaluation. Your health care provider will do a thorough examination and may perform additional tests to rule out other possible causes of your symptoms. To be diagnosed with GAD, a person must have anxiety that:  Is out of his or her control.  Affects several different aspects of his or her life, such as work and relationships.  Causes distress that makes him or her unable to take part in normal activities.  Includes at least three physical symptoms of GAD, such as restlessness, fatigue, trouble concentrating, irritability, muscle tension, or sleep problems. Before your health care provider can confirm a diagnosis of GAD, these symptoms must be present more days than they are not, and they must last for six months or longer. How is this treated? The following therapies are usually used to treat GAD:  Medicine. Antidepressant medicine is usually prescribed for long-term daily control. Antianxiety medicines may be added in severe cases, especially when panic attacks occur.  Talk therapy (psychotherapy). Certain types of talk therapy can be helpful in treating GAD by providing support, education, and guidance. Options include: ? Cognitive behavioral therapy (CBT). People learn coping skills and techniques to ease their anxiety. They learn to identify unrealistic or negative thoughts and behaviors and to replace them with positive ones. ? Acceptance and commitment therapy (ACT). This treatment teaches people how to be mindful as a way to cope with unwanted thoughts and feelings. ? Biofeedback. This process trains you to manage your body's response (physiological response) through breathing techniques and relaxation methods. You will work with a therapist while machines are used to monitor your physical symptoms.  Stress management techniques. These include yoga,  meditation, and exercise. A mental health specialist can help determine which treatment is best for you. Some people see improvement with one type of therapy. However, other people require a combination of therapies. Follow these instructions at home:  Take over-the-counter and prescription medicines only as told by your health care provider.  Try to maintain a normal routine.  Try to anticipate stressful situations and allow extra time to manage them.  Practice any stress management or self-calming techniques as taught by your health care provider.  Do not punish yourself for setbacks or for not making progress.  Try to recognize your accomplishments, even if they are small.  Keep all follow-up visits as told by your health care provider. This is important. Contact a health care provider if:  Your symptoms do not get better.  Your symptoms get worse.  You have signs of depression, such as: ? A persistently sad, cranky, or irritable mood. ? Loss of enjoyment in activities that used to bring you joy. ? Change in weight or eating. ? Changes in sleeping habits. ? Avoiding friends or family members. ? Loss of energy for normal tasks. ? Feelings of guilt or worthlessness. Get help right away if:  You have serious thoughts about hurting yourself or others. If you ever feel like you may hurt yourself or others,  or have thoughts about taking your own life, get help right away. You can go to your nearest emergency department or call:  Your local emergency services (911 in the [place]  A suicide crisis helpline, such as the National Suicide Prevention Lifeline at (984)377-1537. This is open 24 hours a day. Summary  Generalized anxiety disorder (GAD) is a mental health disorder that involves worry that is not triggered by a specific event.  People with GAD often worry excessively about many things in their lives, such as their health and family.  GAD may cause physical symptoms such  as restlessness, trouble concentrating, sleep problems, frequent sweating, nausea, diarrhea, headaches, and trembling or muscle twitching.  A mental health specialist can help determine which treatment is best for you. Some people see improvement with one type of therapy. However, other people require a combination of therapies. This information is not intended to replace advice given to you by your health care provider. Make sure you discuss any questions you have with your health care provider. Document Revised: 09/16/2017 Document Reviewed: 08/24/2016 Elsevier Patient Education  2020 [employer] Living With Depression Everyone experiences occasional disappointment, sadness, and loss in their lives. When you are feeling down, blue, or sad for at least 2 weeks in a row, it may mean that you have depression. Depression can affect your thoughts and feelings, relationships, daily activities, and physical health. It is caused by changes in the way your brain functions. If you receive a diagnosis of depression, your health care provider will tell you which type of depression you have and what treatment options are available to you. If you are living with depression, there are ways to help you recover from it and also ways to prevent it from coming back. How to cope with lifestyle changes Coping with stress     Stress is your body's reaction to life changes and events, both good and bad. Stressful situations may include:  Getting married.  The death of a spouse.  Losing a job.  Retiring.  Having a baby. Stress can last just a few hours or it can be ongoing. Stress can play a major role in depression, so it is important to learn both how to cope with stress and how to think about it differently. Talk with your health care provider or a counselor if you would like to learn more about stress reduction. He or she may suggest some stress reduction techniques, such as:  Music therapy. This can  include creating music or listening to music. Choose music that you enjoy and that inspires you.  Mindfulness-based meditation. This kind of meditation can be done while sitting or walking. It involves being aware of your normal breaths, rather than trying to control your breathing.  Centering prayer. This is a kind of meditation that involves focusing on a spiritual word or phrase. Choose a word, phrase, or sacred image that is meaningful to you and that brings you peace.  Deep breathing. To do this, expand your stomach and inhale slowly through your nose. Hold your breath for 3-5 seconds, then exhale slowly, allowing your stomach muscles to relax.  Muscle relaxation. This involves intentionally tensing muscles then relaxing them. Choose a stress reduction technique that fits your lifestyle and personality. Stress reduction techniques take time and practice to develop. Set aside 5-15 minutes a day to do them. Therapists can offer training in these techniques. The training may be covered by some insurance plans. Other things you can do to manage  stress include:  Keeping a stress diary. This can help you learn what triggers your stress and ways to control your response.  Understanding what your limits are and saying no to requests or events that lead to a schedule that is too full.  Thinking about how you respond to certain situations. You may not be able to control everything, but you can control how you react.  Adding humor to your life by watching funny films or TV shows.  Making time for activities that help you relax and not feeling guilty about spending your time this way.  Medicines Your health care provider may suggest certain medicines if he or she feels that they will help improve your condition. Avoid using alcohol and other substances that may prevent your medicines from working properly (may interact). It is also important to:  Talk with your pharmacist or health care provider  about all the medicines that you take, their possible side effects, and what medicines are safe to take together.  Make it your goal to take part in all treatment decisions (shared decision-making). This includes giving input on the side effects of medicines. It is best if shared decision-making with your health care provider is part of your total treatment plan. If your health care provider prescribes a medicine, you may not notice the full benefits of it for 4-8 weeks. Most people who are treated for depression need to be on medicine for at least 6-12 months after they feel better. If you are taking medicines as part of your treatment, do not stop taking medicines without first talking to your health care provider. You may need to have the medicine slowly decreased (tapered) over time to decrease the risk of harmful side effects. Relationships Your health care provider may suggest family therapy along with individual therapy and drug therapy. While there may not be family problems that are causing you to feel depressed, it is still important to make sure your family learns as much as they can about your mental health. Having your family's support can help make your treatment successful. How to recognize changes in your condition Everyone has a different response to treatment for depression. Recovery from major depression happens when you have not had signs of major depression for two months. This may mean that you will start to:  Have more interest in doing activities.  Feel less hopeless than you did 2 months ago.  Have more energy.  Overeat less often, or have better or improving appetite.  Have better concentration. Your health care provider will work with you to decide the next steps in your recovery. It is also important to recognize when your condition is getting worse. Watch for these signs:  Having fatigue or low energy.  Eating too much or too little.  Sleeping too much or too  little.  Feeling restless, agitated, or hopeless.  Having trouble concentrating or making decisions.  Having unexplained physical complaints.  Feeling irritable, angry, or aggressive. Get help as soon as you or your family members notice these symptoms coming back. How to get support and help from others How to talk with friends and family members about your condition  Talking to friends and family members about your condition can provide you with one way to get support and guidance. Reach out to trusted friends or family members, explain your symptoms to them, and let them know that you are working with a health care provider to treat your depression. Financial resources Not all insurance  plans cover mental health care, so it is important to check with your insurance carrier. If paying for co-pays or counseling services is a problem, search for a local or county mental health care center. They may be able to offer public mental health care services at low or no cost when you are not able to see a private health care provider. If you are taking medicine for depression, you may be able to get the generic form, which may be less expensive. Some makers of prescription medicines also offer help to patients who cannot afford the medicines they need. Follow these instructions at home:   Get the right amount and quality of sleep.  Cut down on using caffeine, tobacco, alcohol, and other potentially harmful substances.  Try to exercise, such as walking or lifting small weights.  Take over-the-counter and prescription medicines only as told by your health care provider.  Eat a healthy diet that includes plenty of vegetables, fruits, whole grains, low-fat dairy products, and lean protein. Do not eat a lot of foods that are high in solid fats, added sugars, or salt.  Keep all follow-up visits as told by your health care provider. This is important. Contact a health care provider if:  You stop  taking your antidepressant medicines, and you have any of these symptoms: ? Nausea. ? Headache. ? Feeling lightheaded. ? Chills and body aches. ? Not being able to sleep (insomnia).  You or your friends and family think your depression is getting worse. Get help right away if:  You have thoughts of hurting yourself or others. If you ever feel like you may hurt yourself or others, or have thoughts about taking your own life, get help right away. You can go to your nearest emergency department or call:  Your local emergency services (911 in the [place]  A suicide crisis helpline, such as the National Suicide Prevention Lifeline at 319-253-4920. This is open 24-hours a day. Summary  If you are living with depression, there are ways to help you recover from it and also ways to prevent it from coming back.  Work with your health care team to create a management plan that includes counseling, stress management techniques, and healthy lifestyle habits. This information is not intended to replace advice given to you by your health care provider. Make sure you discuss any questions you have with your health care provider. Document Revised: 01/26/2019 Document Reviewed: 09/06/2016 Elsevier Patient Education  2020 [employer].

## 2020-08-19 NOTE — Progress Notes (Signed)
Established Patient Office Visit  Subjective:  Patient ID: Peter Bradshaw, male    DOB: 01-27-1996  Age: 24 y.o. MRN: [id number]  CC:  Chief Complaint  Patient presents with  . Anxiety    6 week follow up  . Depression    HPI ABAS LEICHT presents for Depression And Anxiety: Patient complains of depression. He complains of depressed mood, difficulty concentrating and Increased anger. Onset was approximately 1 year ago, gradually worsening since that time.  He denies current suicidal and homicidal plan or intent.   Family history significant for no psychiatric illness.Possible organic causes contributing are: none.  Risk factors: previous episode of depression and personality disorder Previous treatment includes Lexapro. He complains of the following side effects from the treatment: none.  Past Medical History:  Diagnosis Date  . ADHD (attention deficit hyperactivity disorder)   . Anxiety   . Autism   . Autistic behavior   . Cognitive impairment   . Developmental delay   . Hypertension   . Mental developmental delay     History reviewed. No pertinent surgical history.  Family History  Problem Relation Age of Onset  . Depression Mother   . Obesity Father   . Heart Problems Father   . Depression Maternal Grandmother     Social History   Socioeconomic History  . Marital status: Single    Spouse name: Not on file  . Number of children: Not on file  . Years of education: Not on file  . Highest education level: Not on file  Occupational History  . Not on file  Tobacco Use  . Smoking status: Never Smoker  . Smokeless tobacco: Never Used  Vaping Use  . Vaping Use: Never used  Substance and Sexual Activity  . Alcohol use: Never  . Drug use: Never  . Sexual activity: Never  Other Topics Concern  . Not on file  Social History Narrative  . Not on file   Social Determinants of Health   Financial Resource Strain:   . Difficulty of Paying Living Expenses: Not on file    Food Insecurity:   . Worried About Programme researcher, broadcasting/film/video in the Last Year: Not on file  . Ran Out of Food in the Last Year: Not on file  Transportation Needs:   . Lack of Transportation (Medical): Not on file  . Lack of Transportation (Non-Medical): Not on file  Physical Activity:   . Days of Exercise per Week: Not on file  . Minutes of Exercise per Session: Not on file  Stress:   . Feeling of Stress : Not on file  Social Connections:   . Frequency of Communication with Friends and Family: Not on file  . Frequency of Social Gatherings with Friends and Family: Not on file  . Attends Religious Services: Not on file  . Active Member of Clubs or Organizations: Not on file  . Attends Banker Meetings: Not on file  . Marital Status: Not on file  Intimate Partner Violence:   . Fear of Current or Ex-Partner: Not on file  . Emotionally Abused: Not on file  . Physically Abused: Not on file  . Sexually Abused: Not on file    Outpatient Medications Prior to Visit  Medication Sig Dispense Refill  . escitalopram (LEXAPRO) 10 MG tablet Take 1 tablet (10 mg total) by mouth daily. 30 tablet 2  . Multiple Vitamins-Minerals (MULTIVITAMIN WITH MINERALS) tablet Take 1 tablet by mouth daily.    Marland Kitchen  polyethylene glycol (MIRALAX / GLYCOLAX) 17 g packet Take 17 g by mouth daily as needed.    . propranolol ER (INDERAL LA) 80 MG 24 hr capsule Take 80 mg by mouth daily.     No facility-administered medications prior to visit.    No Known Allergies   .ga ROS Review of Systems  Psychiatric/Behavioral: Positive for behavioral problems and sleep disturbance. Negative for self-injury and suicidal ideas. The patient is nervous/anxious and is hyperactive.   All other systems reviewed and are negative.     Objective:    Physical Exam Vitals reviewed. Exam conducted with a chaperone present.  Constitutional:      Appearance: Normal appearance.  HENT:     Head: Normocephalic.   Cardiovascular:     Rate and Rhythm: Normal rate and regular rhythm.     Pulses: Normal pulses.     Heart sounds: Normal heart sounds.  Pulmonary:     Effort: Pulmonary effort is normal.     Breath sounds: Normal breath sounds.  Abdominal:     General: Bowel sounds are normal.  Musculoskeletal:        General: Normal range of motion.  Skin:    General: Skin is warm.  Neurological:     Mental Status: He is alert and oriented to person, place, and time.  Psychiatric:     Comments: Anxiety and depression. Increased anger      BP 137/87   Pulse (!) 109   Temp (!) 97 F (36.1 C) (Temporal)   Ht 5\' 4"  (1.626 m)   Wt 217 lb 9.6 oz (98.7 kg)   SpO2 96%   BMI 37.35 kg/m  Wt Readings from Last 3 Encounters:  08/19/20 217 lb 9.6 oz (98.7 kg)  07/09/20 217 lb 12.8 oz (98.8 kg)  05/21/20 215 lb (97.5 kg)     Health Maintenance Due  Topic Date Due  . TETANUS/TDAP  Never done    There are no preventive care reminders to display for this patient.  No results found for: TSH Lab Results  Component Value Date   WBC 11.0 (H) 05/21/2020   HGB 15.6 05/21/2020   HCT 45.4 05/21/2020   MCV 94.8 05/21/2020   PLT 325 05/21/2020   Lab Results  Component Value Date   NA 137 05/21/2020   K 3.9 05/21/2020   CO2 23 05/21/2020   GLUCOSE 108 (H) 05/21/2020   BUN 21 (H) 05/21/2020   CREATININE 0.94 05/21/2020   BILITOT 0.5 05/21/2020   ALKPHOS 56 05/21/2020   AST 39 05/21/2020   ALT 62 (H) 05/21/2020   PROT 8.5 (H) 05/21/2020   ALBUMIN 4.9 05/21/2020   CALCIUM 9.9 05/21/2020   ANIONGAP 11 05/21/2020      Assessment & Plan:   Problem List Items Addressed This Visit      Other   Severe episode of recurrent major depressive disorder, without psychotic features (HCC) - Primary    Depression and anxiety improved on current medication Lexapro 10 mg tablet daily. No changes to medication dose. Patient is reporting increased anger. Provided education to patient with printed  handouts given. Provided patient and caregiver with resources on counseling, cognitive behavioral therapy, and psychiatric.  Patient is not suicidal today for having thoughts of hurting self and others. Advised to go to the ER with worsening or unresolved symptoms.  Follow-up in 3 months.         No orders of the defined types were placed in this encounter.  Follow-up: Return in about 3 months (around 11/19/2020).    Daryll Drown, NP

## 2020-08-19 NOTE — Assessment & Plan Note (Signed)
Depression and anxiety improved on current medication Lexapro 10 mg tablet daily. No changes to medication dose. Patient is reporting increased anger. Provided education to patient with printed handouts given. Provided patient and caregiver with resources on counseling, cognitive behavioral therapy, and psychiatric.  Patient is not suicidal today for having thoughts of hurting self and others. Advised to go to the ER with worsening or unresolved symptoms.  Follow-up in 3 months.

## 2020-08-21 ENCOUNTER — Ambulatory Visit: Payer: Medicaid Other | Admitting: Nurse Practitioner

## 2020-10-01 ENCOUNTER — Other Ambulatory Visit: Payer: Self-pay | Admitting: Family Medicine

## 2020-10-01 DIAGNOSIS — F411 Generalized anxiety disorder: Secondary | ICD-10-CM

## 2020-10-01 DIAGNOSIS — F332 Major depressive disorder, recurrent severe without psychotic features: Secondary | ICD-10-CM

## 2020-11-19 ENCOUNTER — Other Ambulatory Visit: Payer: Self-pay

## 2020-11-19 ENCOUNTER — Encounter: Payer: Self-pay | Admitting: Family Medicine

## 2020-11-19 ENCOUNTER — Ambulatory Visit (INDEPENDENT_AMBULATORY_CARE_PROVIDER_SITE_OTHER): Payer: Medicaid Other | Admitting: Family Medicine

## 2020-11-19 VITALS — BP 123/78 | HR 94 | Temp 97.1°F | Ht 64.0 in | Wt 217.8 lb

## 2020-11-19 DIAGNOSIS — R61 Generalized hyperhidrosis: Secondary | ICD-10-CM

## 2020-11-19 DIAGNOSIS — F332 Major depressive disorder, recurrent severe without psychotic features: Secondary | ICD-10-CM

## 2020-11-19 DIAGNOSIS — M7989 Other specified soft tissue disorders: Secondary | ICD-10-CM

## 2020-11-19 DIAGNOSIS — F411 Generalized anxiety disorder: Secondary | ICD-10-CM

## 2020-11-19 DIAGNOSIS — E669 Obesity, unspecified: Secondary | ICD-10-CM

## 2020-11-19 MED ORDER — ESCITALOPRAM OXALATE 20 MG PO TABS: 20.0000 mg | ORAL_TABLET | Freq: Every day | ORAL | 2 refills | Status: DC

## 2020-11-19 NOTE — Progress Notes (Signed)
Assessment & Plan:  1. Generalized anxiety disorder - Improving.  Dosage increased from 10 mg to 20 mg once daily. - escitalopram (LEXAPRO) 20 MG tablet; Take 1 tablet (20 mg total) by mouth daily.  Dispense: 30 tablet; Refill: 2 - CMP14+EGFR  2. Severe episode of recurrent major depressive disorder, without psychotic features ([place]) - Improving.  Dosage increased from 10 mg to 20 mg once daily. - escitalopram (LEXAPRO) 20 MG tablet; Take 1 tablet (20 mg total) by mouth daily.  Dispense: 30 tablet; Refill: 2 - CMP14+EGFR  3. Excessive sweating - CBC with Differential/Platelet - Thyroid Panel With TSH  4. Obesity (BMI 35.0-39.9 without comorbidity) - CBC with Differential/Platelet - CMP14+EGFR - Lipid panel - Thyroid Panel With TSH  5. Right leg swelling - No pain or discomfort.  Patient is going to see if it gets better on its own.  Discussed if it starts to bother him or does not improve to let me know and I will place a referral to orthopedic.   Return in about 6 weeks (around 12/31/2020) for Lexapro.  Hendricks Limes, MSN, APRN, FNP-C [facility]  Subjective:    Patient ID: Peter Bradshaw, male    DOB: Jan 04, 1996, 25 y.o.   MRN: [id number]  Patient Care Team: Loman Brooklyn, FNP as PCP - General (Family Medicine)   Chief Complaint:  Chief Complaint  Patient presents with  . Depression    3 month follow up of chronic medical conditions   . Anxiety  . Joint Swelling    Patient states that he noticed one night that he was having right knee swelling.  He is not sure when he noticed it.     HPI: Peter Bradshaw is a 25 y.o. male presenting on 11/19/2020 for Depression (3 month follow up of chronic medical conditions/), Anxiety, and Joint Swelling (Patient states that he noticed one night that he was having right knee swelling.  He is not sure when he noticed it./)  Patient is accompanied by his guardian who he is okay with being  present.  Anxiety/depression: Patient is tolerating the Lexapro 10 mg well.  He is not sure if he can tell a difference or not, but his guardian states he definitely can.  Patient would like to try to increase the dose.  He does report he started having weird freaky dreams about a week ago.  Depression screen [facility] 2/9 08/19/2020 07/09/2020 05/21/2020  Decreased Interest 3 3 0  Down, Depressed, Hopeless 1 2 0  PHQ - 2 Score 4 5 0  Altered sleeping 2 1 -  Tired, decreased energy 2 3 -  Change in appetite 3 3 -  Feeling bad or failure about yourself  3 2 -  Trouble concentrating 2 3 -  Moving slowly or fidgety/restless 0 2 -  Suicidal thoughts 0 2 -  PHQ-9 Score 16 21 -   GAD 7 : Generalized Anxiety Score 08/19/2020 07/09/2020  Nervous, Anxious, on Edge 3 3  Control/stop worrying 2 3  Worry too much - different things 2 2  Trouble relaxing 3 3  Restless 3 3  Easily annoyed or irritable 3 2  Afraid - awful might happen 1 3  Total GAD 7 Score 17 19    New complaints: Patient reports right knee swelling that he noticed a couple of weeks ago.  Denies any pain or injury.  Patient reports he does a lot of sweating even in the wintertime.  Social  history:  Relevant past medical, surgical, family and social history reviewed and updated as indicated. Interim medical history since our last visit reviewed.  Allergies and medications reviewed and updated.  DATA REVIEWED: CHART IN EPIC  ROS: Negative unless specifically indicated above in HPI.    Current Outpatient Medications:  .  escitalopram (LEXAPRO) 10 MG tablet, TAKE 1 TABLET BY MOUTH EVERY DAY, Disp: 30 tablet, Rfl: 1 .  Multiple Vitamins-Minerals (MULTIVITAMIN WITH MINERALS) tablet, Take 1 tablet by mouth daily., Disp: , Rfl:  .  polyethylene glycol (MIRALAX / GLYCOLAX) 17 g packet, Take 17 g by mouth daily as needed., Disp: , Rfl:  .  propranolol ER (INDERAL LA) 80 MG 24 hr capsule, Take 80 mg by mouth daily., Disp: , Rfl:    No  Known Allergies Past Medical History:  Diagnosis Date  . ADHD (attention deficit hyperactivity disorder)   . Anxiety   . Autism   . Autistic behavior   . Cognitive impairment   . Developmental delay   . Hypertension   . Mental developmental delay     History reviewed. No pertinent surgical history.  Social History   Socioeconomic History  . Marital status: Single    Spouse name: Not on file  . Number of children: Not on file  . Years of education: Not on file  . Highest education level: Not on file  Occupational History  . Not on file  Tobacco Use  . Smoking status: Never Smoker  . Smokeless tobacco: Never Used  Vaping Use  . Vaping Use: Never used  Substance and Sexual Activity  . Alcohol use: Never  . Drug use: Never  . Sexual activity: Never  Other Topics Concern  . Not on file  Social History Narrative  . Not on file   Social Determinants of Health   Financial Resource Strain: Not on file  Food Insecurity: Not on file  Transportation Needs: Not on file  Physical Activity: Not on file  Stress: Not on file  Social Connections: Not on file  Intimate Partner Violence: Not on file        Objective:    BP 123/78   Pulse 94   Temp (!) 97.1 F (36.2 C) (Temporal)   Ht _0  (1.626 m)   Wt 217 lb 12.8 oz (98.8 kg)   SpO2 95%   BMI 37.39 kg/m   Wt Readings from Last 3 Encounters:  11/19/20 217 lb 12.8 oz (98.8 kg)  08/19/20 217 lb 9.6 oz (98.7 kg)  07/09/20 217 lb 12.8 oz (98.8 kg)    Physical Exam Vitals reviewed.  Constitutional:      General: He is not in acute distress.    Appearance: Normal appearance. He is diaphoretic. He is not ill-appearing or toxic-appearing.  HENT:     Head: Normocephalic and atraumatic.  Eyes:     General: No scleral icterus.       Right eye: No discharge.        Left eye: No discharge.     Conjunctiva/sclera: Conjunctivae normal.  Cardiovascular:     Rate and Rhythm: Normal rate.  Pulmonary:     Effort:  Pulmonary effort is normal. No respiratory distress.  Musculoskeletal:        General: Normal range of motion.     Cervical back: Normal range of motion.     Right knee: Swelling present. No deformity, erythema, ecchymosis or bony tenderness. Normal range of motion. No tenderness. No LCL laxity, MCL  laxity, ACL laxity or PCL laxity. Normal alignment, normal meniscus and normal patellar mobility. Normal pulse.     Instability Tests: Anterior drawer test negative. Posterior drawer test negative. Anterior Lachman test negative. Medial McMurray test negative and lateral McMurray test negative.     Comments: Swelling to lateral aspect of right knee - not fluctuant.   Skin:    General: Skin is warm.  Neurological:     Mental Status: He is alert and oriented to person, place, and time. Mental status is at baseline.  Psychiatric:        Mood and Affect: Mood normal.        Behavior: Behavior normal.        Thought Content: Thought content normal.        Judgment: Judgment normal.     No results found for: TSH Lab Results  Component Value Date   WBC 11.0 (H) 05/21/2020   HGB 15.6 05/21/2020   HCT 45.4 05/21/2020   MCV 94.8 05/21/2020   PLT 325 05/21/2020   Lab Results  Component Value Date   NA 137 05/21/2020   K 3.9 05/21/2020   CO2 23 05/21/2020   GLUCOSE 108 (H) 05/21/2020   BUN 21 (H) 05/21/2020   CREATININE 0.94 05/21/2020   BILITOT 0.5 05/21/2020   ALKPHOS 56 05/21/2020   AST 39 05/21/2020   ALT 62 (H) 05/21/2020   PROT 8.5 (H) 05/21/2020   ALBUMIN 4.9 05/21/2020   CALCIUM 9.9 05/21/2020   ANIONGAP 11 05/21/2020   No results found for: CHOL No results found for: HDL No results found for: LDLCALC No results found for: TRIG No results found for: CHOLHDL No results found for: HGBA1C

## 2020-11-20 LAB — CBC WITH DIFFERENTIAL/PLATELET
Basophils Absolute: 0.1 10*3/uL (ref 0.0–0.2)
Basos: 1 %
EOS (ABSOLUTE): 0.2 10*3/uL (ref 0.0–0.4)
Eos: 3 %
Hematocrit: 43.7 % (ref 37.5–51.0)
Hemoglobin: 15.4 g/dL (ref 13.0–17.7)
Immature Grans (Abs): 0.1 10*3/uL (ref 0.0–0.1)
Immature Granulocytes: 1 %
Lymphocytes Absolute: 1.9 10*3/uL (ref 0.7–3.1)
Lymphs: 23 %
MCH: 33 pg (ref 26.6–33.0)
MCHC: 35.2 g/dL (ref 31.5–35.7)
MCV: 94 fL (ref 79–97)
Monocytes Absolute: 0.8 10*3/uL (ref 0.1–0.9)
Monocytes: 10 %
Neutrophils Absolute: 5 10*3/uL (ref 1.4–7.0)
Neutrophils: 62 %
Platelets: 308 10*3/uL (ref 150–450)
RBC: 4.66 x10E6/uL (ref 4.14–5.80)
RDW: 13.6 % (ref 11.6–15.4)
WBC: 7.9 10*3/uL (ref 3.4–10.8)

## 2020-11-20 LAB — LIPID PANEL
Chol/HDL Ratio: 7.7 ratio — ABNORMAL HIGH (ref 0.0–5.0)
Cholesterol, Total: 192 mg/dL (ref 100–199)
HDL: 25 mg/dL — ABNORMAL LOW (ref >39)
LDL Chol Calc (NIH): 69 mg/dL (ref 0–99)
Triglycerides: 632 mg/dL (ref 0–149)
VLDL Cholesterol Cal: 98 mg/dL — ABNORMAL HIGH (ref 5–40)

## 2020-11-20 LAB — CMP14+EGFR
ALT: 56 IU/L — ABNORMAL HIGH (ref 0–44)
AST: 29 IU/L (ref 0–40)
Albumin/Globulin Ratio: 1.8 (ref 1.2–2.2)
Albumin: 4.9 g/dL (ref 4.1–5.2)
Alkaline Phosphatase: 67 IU/L (ref 44–121)
BUN/Creatinine Ratio: 18 (ref 9–20)
BUN: 17 mg/dL (ref 6–20)
Bilirubin Total: 0.3 mg/dL (ref 0.0–1.2)
CO2: 22 mmol/L (ref 20–29)
Calcium: 10.4 mg/dL — ABNORMAL HIGH (ref 8.7–10.2)
Chloride: 100 mmol/L (ref 96–106)
Creatinine, Ser: 0.97 mg/dL (ref 0.76–1.27)
GFR calc Af Amer: 127 mL/min/{1.73_m2} (ref >59)
GFR calc non Af Amer: 110 mL/min/{1.73_m2} (ref >59)
Globulin, Total: 2.7 g/dL (ref 1.5–4.5)
Glucose: 141 mg/dL — ABNORMAL HIGH (ref 65–99)
Potassium: 4.5 mmol/L (ref 3.5–5.2)
Sodium: 139 mmol/L (ref 134–144)
Total Protein: 7.6 g/dL (ref 6.0–8.5)

## 2020-11-20 LAB — THYROID PANEL WITH TSH
Free Thyroxine Index: 2 (ref 1.2–4.9)
T3 Uptake Ratio: 29 % (ref 24–39)
T4, Total: 6.8 ug/dL (ref 4.5–12.0)
TSH: 2.16 u[IU]/mL (ref 0.450–4.500)

## 2021-01-01 ENCOUNTER — Ambulatory Visit (INDEPENDENT_AMBULATORY_CARE_PROVIDER_SITE_OTHER): Payer: Medicaid Other | Admitting: Family Medicine

## 2021-01-01 ENCOUNTER — Other Ambulatory Visit: Payer: Self-pay

## 2021-01-01 ENCOUNTER — Encounter: Payer: Self-pay | Admitting: Family Medicine

## 2021-01-01 VITALS — BP 111/68 | HR 82 | Temp 96.0°F | Ht 64.0 in | Wt 216.4 lb

## 2021-01-01 DIAGNOSIS — E782 Mixed hyperlipidemia: Secondary | ICD-10-CM | POA: Insufficient documentation

## 2021-01-01 DIAGNOSIS — F411 Generalized anxiety disorder: Secondary | ICD-10-CM

## 2021-01-01 DIAGNOSIS — E1169 Type 2 diabetes mellitus with other specified complication: Secondary | ICD-10-CM | POA: Insufficient documentation

## 2021-01-01 DIAGNOSIS — F332 Major depressive disorder, recurrent severe without psychotic features: Secondary | ICD-10-CM

## 2021-01-01 MED ORDER — ESCITALOPRAM OXALATE 20 MG PO TABS: 20.0000 mg | ORAL_TABLET | Freq: Every day | ORAL | 1 refills | Status: DC

## 2021-01-01 NOTE — Progress Notes (Addendum)
Assessment & Plan:  1. Generalized anxiety disorder Well controlled on current regimen.  - escitalopram (LEXAPRO) 20 MG tablet; Take 1 tablet (20 mg total) by mouth daily.  Dispense: 90 tablet; Refill: 1 - BMP8+EGFR; Future  2. Severe episode of recurrent major depressive disorder, without psychotic features ([place]) Well controlled on current regimen.  - escitalopram (LEXAPRO) 20 MG tablet; Take 1 tablet (20 mg total) by mouth daily.  Dispense: 90 tablet; Refill: 1  3. Hypercholesterolemia with hypertriglyceridemia Patient to return for fasting lab work. - BMP8+EGFR; Future - Lipid panel; Future   Return as directed after labs result.  Peter Limes, MSN, APRN, FNP-C [facility]  Subjective:    Patient ID: Peter Bradshaw, male    DOB: 09-Mar-1996, 25 y.o.   MRN: [id number]  Patient Care Team: Loman Brooklyn, FNP as PCP - General (Family Medicine)   Chief Complaint:  Chief Complaint  Patient presents with  . Anxiety  . Depression    6 week follow up     HPI: Peter Bradshaw is a 25 y.o. male presenting on 01/01/2021 for Anxiety and Depression (6 week follow up )  Patient is accompanied by his guardian who he is okay with being present.  Patient is following up on his Lexapro dose being increased from 10 mg to 20 mg.  He and his guardian feel like it is working well for him at this time.  They desire no further changes in medication.  Depression screen [facility] 2/9 01/01/2021 11/19/2020 08/19/2020  Decreased Interest _0 Down, Depressed, Hopeless _1 PHQ - 2 Score _2 Altered sleeping _3 Tired, decreased energy 0 2 2  Change in appetite _4 Feeling bad or failure about yourself  _5 Trouble concentrating _6 Moving slowly or fidgety/restless 0 1 0  Suicidal thoughts 0 0 0  PHQ-9 Score _7 Difficult doing work/chores Not difficult at all Not difficult at all -   GAD 7 : Generalized Anxiety Score 01/01/2021 11/19/2020 08/19/2020  07/09/2020  Nervous, Anxious, on Edge _8 Control/stop worrying _9 Worry too much - different things 0 _10 Trouble relaxing _11 Restless _12 Easily annoyed or irritable 3 0 3 2  Afraid - awful might happen _13 Total GAD 7 Score _14 Anxiety Difficulty Not difficult at all Not difficult at all - -    New complaints: None  Social history:  Relevant past medical, surgical, family and social history reviewed and updated as indicated. Interim medical history since our last visit reviewed.  Allergies and medications reviewed and updated.  DATA REVIEWED: CHART IN EPIC  ROS: Negative unless specifically indicated above in HPI.    Current Outpatient Medications:  .  escitalopram (LEXAPRO) 20 MG tablet, Take 1 tablet (20 mg total) by mouth daily., Disp: 30 tablet, Rfl: 2 .  Multiple Vitamins-Minerals (MULTIVITAMIN WITH MINERALS) tablet, Take 1 tablet by mouth daily., Disp: , Rfl:  .  polyethylene glycol (MIRALAX / GLYCOLAX) 17 g packet, Take 17 g by mouth daily as needed., Disp: , Rfl:  .  propranolol ER (INDERAL LA) 80 MG 24 hr capsule, Take 80 mg by mouth daily., Disp: , Rfl:    No Known Allergies Past Medical History:  Diagnosis Date  . ADHD (attention deficit hyperactivity disorder)   . Anxiety   . Autism   . Autistic behavior   . Cognitive impairment   . Developmental delay   . Hypertension   . Mental developmental delay     History reviewed. No pertinent surgical history.  Social History   Socioeconomic History  . Marital status: Single    Spouse name: Not on file  . Number of children: Not on file  . Years of education: Not on file  . Highest education level: Not on file  Occupational History  . Not on file  Tobacco Use  . Smoking status: Never Smoker  . Smokeless tobacco: Never Used  Vaping Use  . Vaping Use: Never used  Substance and Sexual Activity  . Alcohol use: Never  . Drug use: Never  . Sexual activity: Never   Other Topics Concern  . Not on file  Social History Narrative  . Not on file   Social Determinants of Health   Financial Resource Strain: Not on file  Food Insecurity: Not on file  Transportation Needs: Not on file  Physical Activity: Not on file  Stress: Not on file  Social Connections: Not on file  Intimate Partner Violence: Not on file        Objective:    BP 111/68   Pulse 82   Temp (!) 96 F (35.6 C) (Temporal)   Ht _0  (1.626 m)   Wt 216 lb 6.4 oz (98.2 kg)   SpO2 96%   BMI 37.14 kg/m   Wt Readings from Last 3 Encounters:  01/01/21 216 lb 6.4 oz (98.2 kg)  11/19/20 217 lb 12.8 oz (98.8 kg)  08/19/20 217 lb 9.6 oz (98.7 kg)    Physical Exam Vitals reviewed.  Constitutional:      General: He is not in acute distress.    Appearance: Normal appearance. He is obese. He is not ill-appearing, toxic-appearing or diaphoretic.  HENT:     Head: Normocephalic and atraumatic.  Eyes:     General: No scleral icterus.       Right eye: No discharge.        Left eye: No discharge.     Conjunctiva/sclera: Conjunctivae normal.  Cardiovascular:     Rate and Rhythm: Normal rate and regular rhythm.     Heart sounds: Normal heart sounds. No murmur heard. No friction rub. No gallop.   Pulmonary:     Effort: Pulmonary effort is normal. No respiratory distress.     Breath sounds: Normal breath sounds. No stridor. No wheezing, rhonchi or rales.  Musculoskeletal:        General: Normal range of motion.     Cervical back: Normal range of motion.  Skin:    General: Skin is warm and dry.  Neurological:     Mental Status: He is alert and oriented to person, place, and time. Mental status is at baseline.  Psychiatric:        Mood and Affect: Mood normal.        Behavior: Behavior normal.        Thought Content: Thought content normal.        Judgment: Judgment normal.     Lab Results  Component Value Date   TSH 2.160 11/19/2020   Lab Results  Component Value Date    WBC 7.9 11/19/2020   HGB 15.4 11/19/2020   HCT 43.7 11/19/2020   MCV 94 11/19/2020   PLT 308 11/19/2020  Lab Results  Component Value Date   NA 139 11/19/2020   K 4.5 11/19/2020   CO2 22 11/19/2020   GLUCOSE 141 (H) 11/19/2020   BUN 17 11/19/2020   CREATININE 0.97 11/19/2020   BILITOT 0.3 11/19/2020   ALKPHOS 67 11/19/2020   AST 29 11/19/2020   ALT 56 (H) 11/19/2020   PROT 7.6 11/19/2020   ALBUMIN 4.9 11/19/2020   CALCIUM 10.4 (H) 11/19/2020   ANIONGAP 11 05/21/2020   Lab Results  Component Value Date   CHOL 192 11/19/2020   Lab Results  Component Value Date   HDL 25 (L) 11/19/2020   Lab Results  Component Value Date   LDLCALC 69 11/19/2020   Lab Results  Component Value Date   TRIG 632 (HH) 11/19/2020   Lab Results  Component Value Date   CHOLHDL 7.7 (H) 11/19/2020   No results found for: HGBA1C

## 2021-01-19 ENCOUNTER — Other Ambulatory Visit: Payer: Medicaid Other

## 2021-01-19 ENCOUNTER — Other Ambulatory Visit: Payer: Self-pay

## 2021-01-19 DIAGNOSIS — E782 Mixed hyperlipidemia: Secondary | ICD-10-CM

## 2021-01-19 DIAGNOSIS — F411 Generalized anxiety disorder: Secondary | ICD-10-CM

## 2021-01-20 ENCOUNTER — Encounter: Payer: Self-pay | Admitting: Family Medicine

## 2021-01-20 DIAGNOSIS — E782 Mixed hyperlipidemia: Secondary | ICD-10-CM | POA: Insufficient documentation

## 2021-01-20 HISTORY — DX: Mixed hyperlipidemia: E78.2

## 2021-01-20 LAB — LIPID PANEL
Chol/HDL Ratio: 7.6 ratio — ABNORMAL HIGH (ref 0.0–5.0)
Cholesterol, Total: 212 mg/dL — ABNORMAL HIGH (ref 100–199)
HDL: 28 mg/dL — ABNORMAL LOW (ref >39)
LDL Chol Calc (NIH): 109 mg/dL — ABNORMAL HIGH (ref 0–99)
Triglycerides: 434 mg/dL — ABNORMAL HIGH (ref 0–149)
VLDL Cholesterol Cal: 75 mg/dL — ABNORMAL HIGH (ref 5–40)

## 2021-01-20 LAB — BMP8+EGFR
BUN/Creatinine Ratio: 15 (ref 9–20)
BUN: 14 mg/dL (ref 6–20)
CO2: 23 mmol/L (ref 20–29)
Calcium: 10 mg/dL (ref 8.7–10.2)
Chloride: 100 mmol/L (ref 96–106)
Creatinine, Ser: 0.91 mg/dL (ref 0.76–1.27)
Glucose: 154 mg/dL — ABNORMAL HIGH (ref 65–99)
Potassium: 4.7 mmol/L (ref 3.5–5.2)
Sodium: 140 mmol/L (ref 134–144)
eGFR: 121 mL/min/{1.73_m2} (ref >59)

## 2021-01-21 ENCOUNTER — Encounter: Payer: Self-pay | Admitting: Family Medicine

## 2021-01-21 ENCOUNTER — Telehealth: Payer: Self-pay

## 2021-01-21 ENCOUNTER — Other Ambulatory Visit: Payer: Self-pay | Admitting: Family Medicine

## 2021-01-21 DIAGNOSIS — R7309 Other abnormal glucose: Secondary | ICD-10-CM

## 2021-01-21 DIAGNOSIS — R7303 Prediabetes: Secondary | ICD-10-CM

## 2021-01-21 DIAGNOSIS — E119 Type 2 diabetes mellitus without complications: Secondary | ICD-10-CM | POA: Insufficient documentation

## 2021-01-21 HISTORY — DX: Type 2 diabetes mellitus without complications: E11.9

## 2021-01-21 HISTORY — DX: Prediabetes: R73.03

## 2021-01-21 LAB — BAYER DCA HB A1C WAIVED: HB A1C (BAYER DCA - WAIVED): 6.9 % (ref <7.0)

## 2021-01-21 NOTE — Telephone Encounter (Signed)
A1c ordered.

## 2021-02-27 ENCOUNTER — Telehealth: Payer: Self-pay

## 2021-02-27 MED ORDER — PROPRANOLOL HCL ER 80 MG PO CP24: 80.0000 mg | ORAL_CAPSULE | Freq: Every day | ORAL | 1 refills | Status: DC

## 2021-02-27 NOTE — Telephone Encounter (Signed)
Refilled

## 2021-02-27 NOTE — Telephone Encounter (Signed)
We have not filled - please advise if this is okay to fill.

## 2021-02-27 NOTE — Telephone Encounter (Signed)
  Prescription Request  02/27/2021  What is the name of the medication or equipment? ranolol ER (INDERAL LA) 80 MG 24 hr capsule  Have you contacted your pharmacy to request a refill? (if applicable) no  Which pharmacy would you [facility] this sent to?[facility]    Patient notified that their request is being sent to the clinical staff for review and that they should receive a response within 2 business days.

## 2021-03-04 ENCOUNTER — Encounter: Payer: Self-pay | Admitting: Family Medicine

## 2021-03-04 ENCOUNTER — Ambulatory Visit (INDEPENDENT_AMBULATORY_CARE_PROVIDER_SITE_OTHER): Payer: Medicaid Other | Admitting: Family Medicine

## 2021-03-04 ENCOUNTER — Other Ambulatory Visit: Payer: Self-pay

## 2021-03-04 VITALS — BP 116/75 | HR 90 | Temp 96.6°F | Ht 64.0 in | Wt 215.8 lb

## 2021-03-04 DIAGNOSIS — F411 Generalized anxiety disorder: Secondary | ICD-10-CM | POA: Diagnosis not present

## 2021-03-04 DIAGNOSIS — Z23 Encounter for immunization: Secondary | ICD-10-CM | POA: Diagnosis not present

## 2021-03-04 DIAGNOSIS — E1165 Type 2 diabetes mellitus with hyperglycemia: Secondary | ICD-10-CM

## 2021-03-04 DIAGNOSIS — F332 Major depressive disorder, recurrent severe without psychotic features: Secondary | ICD-10-CM

## 2021-03-04 NOTE — Progress Notes (Signed)
Assessment & Plan:  1. Type 2 diabetes mellitus with hyperglycemia, without long-term current use of insulin (HCC) Lab Results  Component Value Date   HGBA1C 6.9 01/19/2021    - Diabetes is at goal of A1c < 7. - Medications: not currently on medication. Diet and exercise encouraged.  - Home glucose monitoring: not monitoring - Patient is not currently taking a statin. Patient is not taking an ACE-inhibitor/ARB.  - Instruction/counseling given: reminded to get eye exam, discussed foot care, discussed the need for weight loss and discussed diet  Diabetes Health Maintenance Due  Topic Date Due  . OPHTHALMOLOGY EXAM  Never done  . URINE MICROALBUMIN  Never done  . HEMOGLOBIN A1C  07/21/2021  . FOOT EXAM  03/04/2022    - Microalbumin / creatinine urine ratio - Pneumococcal polysaccharide vaccine 23-valent greater than or equal to 2yo subcutaneous/IM  2. Generalized anxiety disorder Well controlled on current regimen.   3. Severe episode of recurrent major depressive disorder, without psychotic features ([place]) Well controlled on current regimen.   4. Immunization due - Pneumococcal polysaccharide vaccine 23-valent greater than or equal to 2yo subcutaneous/IM - given in office.   Return in about 3 months (around 06/04/2021) for annual physical.  Hendricks Limes, MSN, APRN, FNP-C [facility]  Subjective:    Patient ID: Peter Bradshaw, male    DOB: 17-Sep-1996, 25 y.o.   MRN: [id number]  Patient Care Team: Loman Brooklyn, FNP as PCP - General (Family Medicine)   Chief Complaint:  Chief Complaint  Patient presents with  . Anxiety    6 week follow up     HPI: MATAN STEEN is a 25 y.o. male presenting on 03/04/2021 for Anxiety (6 week follow up )  Patient is alone today. His guardian is waiting in the waiting room.  Diabetes: Patient presents for follow up of diabetes. Current symptoms include: polydipsia and polyuria. Known diabetic complications: none.  Medication compliance: not currently on medications. Current diet: in general, a "healthy" diet  . Current exercise: plans to start swimming after they move (in the next week). Home blood sugar records: patient does not check sugars. Is he  on ACE inhibitor or angiotensin II receptor blocker? No. Is he on a statin? No.   Anxiety/Depression: feels he is doing well on current dose of medication.   Depression screen [facility] 2/9 03/04/2021 01/01/2021 11/19/2020  Decreased Interest '2 3 2  ' Down, Depressed, Hopeless '2 1 2  ' PHQ - 2 Score '4 4 4  ' Altered sleeping '2 2 3  ' Tired, decreased energy 2 0 2  Change in appetite 0 3 2  Feeling bad or failure about yourself  '2 2 1  ' Trouble concentrating '3 2 3  ' Moving slowly or fidgety/restless 0 0 1  Suicidal thoughts 0 0 0  PHQ-9 Score '13 13 16  ' Difficult doing work/chores Not difficult at all Not difficult at all Not difficult at all   GAD 7 : Generalized Anxiety Score 03/04/2021 01/01/2021 11/19/2020 08/19/2020  Nervous, Anxious, on Edge '1 1 1 3  ' Control/stop worrying 0 '1 1 2  ' Worry too much - different things 0 0 1 2  Trouble relaxing 0 '2 2 3  ' Restless 0 '2 2 3  ' Easily annoyed or irritable 3 3 0 3  Afraid - awful might happen 0 '2 2 1  ' Total GAD 7 Score '4 11 9 17  ' Anxiety Difficulty Not difficult at all Not difficult at all Not difficult at  all -    New complaints: None  Social history:  Relevant past medical, surgical, family and social history reviewed and updated as indicated. Interim medical history since our last visit reviewed.  Allergies and medications reviewed and updated.  DATA REVIEWED: CHART IN EPIC  ROS: Negative unless specifically indicated above in HPI.    Current Outpatient Medications:  .  escitalopram (LEXAPRO) 20 MG tablet, Take 1 tablet (20 mg total) by mouth daily., Disp: 90 tablet, Rfl: 1 .  Multiple Vitamins-Minerals (MULTIVITAMIN WITH MINERALS) tablet, Take 1 tablet by mouth daily., Disp: , Rfl:  .  polyethylene glycol  (MIRALAX / GLYCOLAX) 17 g packet, Take 17 g by mouth daily as needed., Disp: , Rfl:  .  propranolol ER (INDERAL LA) 80 MG 24 hr capsule, Take 1 capsule (80 mg total) by mouth daily., Disp: 90 capsule, Rfl: 1   No Known Allergies Past Medical History:  Diagnosis Date  . ADHD (attention deficit hyperactivity disorder)   . Anxiety   . Autism   . Autistic behavior   . Cognitive impairment   . Developmental delay   . Diabetes mellitus, type 2 ([place]) 01/21/2021  . Hypertension   . Mental developmental delay   . Mixed hyperlipidemia 01/20/2021    History reviewed. No pertinent surgical history.  Social History   Socioeconomic History  . Marital status: Single    Spouse name: Not on file  . Number of children: Not on file  . Years of education: Not on file  . Highest education level: Not on file  Occupational History  . Not on file  Tobacco Use  . Smoking status: Never Smoker  . Smokeless tobacco: Never Used  Vaping Use  . Vaping Use: Never used  Substance and Sexual Activity  . Alcohol use: Never  . Drug use: Never  . Sexual activity: Never  Other Topics Concern  . Not on file  Social History Narrative  . Not on file   Social Determinants of Health   Financial Resource Strain: Not on file  Food Insecurity: Not on file  Transportation Needs: Not on file  Physical Activity: Not on file  Stress: Not on file  Social Connections: Not on file  Intimate Partner Violence: Not on file        Objective:    BP 116/75   Pulse 90   Temp (!) 96.6 F (35.9 C) (Temporal)   Ht '5\' 4"'  (1.626 m)   Wt 215 lb 12.8 oz (97.9 kg)   SpO2 95%   BMI 37.04 kg/m   Wt Readings from Last 3 Encounters:  03/04/21 215 lb 12.8 oz (97.9 kg)  01/01/21 216 lb 6.4 oz (98.2 kg)  11/19/20 217 lb 12.8 oz (98.8 kg)    Physical Exam Vitals reviewed.  Constitutional:      General: He is not in acute distress.    Appearance: Normal appearance. He is morbidly obese. He is not ill-appearing,  toxic-appearing or diaphoretic.  HENT:     Head: Normocephalic and atraumatic.  Eyes:     General: No scleral icterus.       Right eye: No discharge.        Left eye: No discharge.     Conjunctiva/sclera: Conjunctivae normal.  Cardiovascular:     Rate and Rhythm: Normal rate.  Pulmonary:     Effort: Pulmonary effort is normal. No respiratory distress.  Musculoskeletal:        General: Normal range of motion.  Cervical back: Normal range of motion.  Skin:    General: Skin is warm and dry.  Neurological:     Mental Status: He is alert and oriented to person, place, and time. Mental status is at baseline.  Psychiatric:        Mood and Affect: Mood normal.        Behavior: Behavior normal.        Thought Content: Thought content normal.        Judgment: Judgment normal.     Lab Results  Component Value Date   TSH 2.160 11/19/2020   Lab Results  Component Value Date   WBC 7.9 11/19/2020   HGB 15.4 11/19/2020   HCT 43.7 11/19/2020   MCV 94 11/19/2020   PLT 308 11/19/2020   Lab Results  Component Value Date   NA 140 01/19/2021   K 4.7 01/19/2021   CO2 23 01/19/2021   GLUCOSE 154 (H) 01/19/2021   BUN 14 01/19/2021   CREATININE 0.91 01/19/2021   BILITOT 0.3 11/19/2020   ALKPHOS 67 11/19/2020   AST 29 11/19/2020   ALT 56 (H) 11/19/2020   PROT 7.6 11/19/2020   ALBUMIN 4.9 11/19/2020   CALCIUM 10.0 01/19/2021   ANIONGAP 11 05/21/2020   EGFR 121 01/19/2021   Lab Results  Component Value Date   CHOL 212 (H) 01/19/2021   Lab Results  Component Value Date   HDL 28 (L) 01/19/2021   Lab Results  Component Value Date   LDLCALC 109 (H) 01/19/2021   Lab Results  Component Value Date   TRIG 434 (H) 01/19/2021   Lab Results  Component Value Date   CHOLHDL 7.6 (H) 01/19/2021   Lab Results  Component Value Date   HGBA1C 6.9 01/19/2021

## 2021-03-04 NOTE — Patient Instructions (Signed)
Please schedule a diabetic eye exam.

## 2021-03-05 LAB — MICROALBUMIN / CREATININE URINE RATIO
Creatinine, Urine: 182.7 mg/dL
Microalb/Creat Ratio: 4 mg/g creat (ref 0–29)
Microalbumin, Urine: 7.7 ug/mL

## 2021-06-05 ENCOUNTER — Encounter: Payer: Self-pay | Admitting: Family Medicine

## 2021-06-05 ENCOUNTER — Other Ambulatory Visit: Payer: Self-pay

## 2021-06-05 ENCOUNTER — Ambulatory Visit (INDEPENDENT_AMBULATORY_CARE_PROVIDER_SITE_OTHER): Payer: Medicaid Other | Admitting: Family Medicine

## 2021-06-05 VITALS — BP 103/66 | HR 77 | Temp 97.9°F | Resp 20 | Ht 64.0 in | Wt 209.0 lb

## 2021-06-05 DIAGNOSIS — F411 Generalized anxiety disorder: Secondary | ICD-10-CM | POA: Diagnosis not present

## 2021-06-05 DIAGNOSIS — F332 Major depressive disorder, recurrent severe without psychotic features: Secondary | ICD-10-CM

## 2021-06-05 DIAGNOSIS — Z0001 Encounter for general adult medical examination with abnormal findings: Secondary | ICD-10-CM

## 2021-06-05 DIAGNOSIS — E782 Mixed hyperlipidemia: Secondary | ICD-10-CM

## 2021-06-05 DIAGNOSIS — E1165 Type 2 diabetes mellitus with hyperglycemia: Secondary | ICD-10-CM | POA: Diagnosis not present

## 2021-06-05 DIAGNOSIS — R454 Irritability and anger: Secondary | ICD-10-CM

## 2021-06-05 DIAGNOSIS — Z0000 Encounter for general adult medical examination without abnormal findings: Secondary | ICD-10-CM

## 2021-06-05 DIAGNOSIS — F840 Autistic disorder: Secondary | ICD-10-CM

## 2021-06-05 MED ORDER — QUETIAPINE FUMARATE 25 MG PO TABS: 25.0000 mg | ORAL_TABLET | Freq: Every day | ORAL | 2 refills | Status: DC

## 2021-06-05 NOTE — Progress Notes (Signed)
Assessment & Plan:  1. Well adult exam Preventive health education provided. - CBC with Differential/Platelet; Future - CMP14+EGFR; Future - Lipid panel; Future  2. Type 2 diabetes mellitus with hyperglycemia, without long-term current use of insulin ([place]) Patient to return for fasting blood work next week. Continue dietary changes. Education provided. Exercise encouraged. Not currently on medications, ACE/ARB, or statin. - Bayer DCA Hb A1c Waived; Future - CBC with Differential/Platelet; Future - CMP14+EGFR; Future - Lipid panel; Future - Ambulatory referral to Optometry  3. Hypercholesterolemia with hypertriglyceridemia Labs to assess since adding fish oil. - Lipid panel; Future  4. Generalized anxiety disorder Well controlled on current regimen.  - CMP14+EGFR; Future  5. Severe episode of recurrent major depressive disorder, without psychotic features ([place]) Well controlled on current regimen.  - CMP14+EGFR; Future  6. Autism spectrum disorder Started Seroquel. - QUEtiapine (SEROQUEL) 25 MG tablet; Take 1 tablet (25 mg total) by mouth at bedtime.  Dispense: 30 tablet; Refill: 2 - Ambulatory referral to Psychiatry  7. Difficulty controlling anger Started Seroquel. - QUEtiapine (SEROQUEL) 25 MG tablet; Take 1 tablet (25 mg total) by mouth at bedtime.  Dispense: 30 tablet; Refill: 2 - Ambulatory referral to Psychiatry   Follow-up: Return in about 4 weeks (around 07/03/2021) for Anger.   Hendricks Limes, MSN, APRN, FNP-C [facility]  Subjective:  Patient ID: Peter Bradshaw, male    DOB: 09/21/1996  Age: 25 y.o. MRN: [id number]  Patient Care Team: Loman Brooklyn, FNP as PCP - General (Family Medicine)   CC:  Chief Complaint  Patient presents with   Annual Exam    CPE     HPI Peter Bradshaw presents for his annual physical. He is accompanied by his guardian.   Occupation: none, Marital status: single, Substance use: none Diet: decreasing  carbs and sweets due to diabetes, Exercise: yes Last eye exam: not recent - requesting referral Last dental exam: not sure, but he is established Hepatitis C Screening: Declined Immunizations: Flu Vaccine: declined Tdap Vaccine: declined  COVID-19 Vaccine: declined Pneumonia Vaccine: up to date  DEPRESSION SCREENING PHQ 2/9 Scores 03/04/2021 01/01/2021 11/19/2020 08/19/2020 07/09/2020 05/21/2020  PHQ - 2 Score '4 4 4 4 5 ' 0  PHQ- 9 Score '13 13 16 16 21 ' -     Patient is concerned about his anger issues and would like something to help him.  He also reports it sounds like people are talking after using helium.   Review of Systems  Constitutional:  Negative for chills, fever, malaise/fatigue and weight loss.  HENT:  Negative for congestion, ear discharge, ear pain, nosebleeds, sinus pain, sore throat and tinnitus.   Eyes:  Negative for blurred vision, double vision, pain, discharge and redness.  Respiratory:  Negative for cough, shortness of breath and wheezing.   Cardiovascular:  Negative for chest pain, palpitations and leg swelling.  Gastrointestinal:  Negative for abdominal pain, constipation, diarrhea, heartburn, nausea and vomiting.  Genitourinary:  Negative for dysuria, frequency and urgency.  Musculoskeletal:  Negative for myalgias.  Skin:  Negative for rash.  Neurological:  Negative for dizziness, seizures, weakness and headaches.  Psychiatric/Behavioral:  Negative for depression, substance abuse and suicidal ideas. The patient is not nervous/anxious.     Current Outpatient Medications:    escitalopram (LEXAPRO) 20 MG tablet, Take 1 tablet (20 mg total) by mouth daily., Disp: 90 tablet, Rfl: 1   Multiple Vitamins-Minerals (MULTIVITAMIN WITH MINERALS) tablet, Take 1 tablet by mouth daily., Disp: , Rfl:  polyethylene glycol (MIRALAX / GLYCOLAX) 17 g packet, Take 17 g by mouth daily as needed., Disp: , Rfl:    propranolol ER (INDERAL LA) 80 MG 24 hr capsule, Take 1 capsule (80 mg  total) by mouth daily., Disp: 90 capsule, Rfl: 1  No Known Allergies  Past Medical History:  Diagnosis Date   ADHD (attention deficit hyperactivity disorder)    Anxiety    Autism    Autistic behavior    Cognitive impairment    Developmental delay    Diabetes mellitus, type 2 ([place]) 01/21/2021   Hypertension    Mental developmental delay    Mixed hyperlipidemia 01/20/2021    History reviewed. No pertinent surgical history.  Family History  Problem Relation Age of Onset   Depression Mother    Obesity Father    Heart Problems Father    Depression Maternal Grandmother     Social History   Socioeconomic History   Marital status: Single    Spouse name: Not on file   Number of children: Not on file   Years of education: Not on file   Highest education level: Not on file  Occupational History   Not on file  Tobacco Use   Smoking status: Never   Smokeless tobacco: Never  Vaping Use   Vaping Use: Never used  Substance and Sexual Activity   Alcohol use: Never   Drug use: Never   Sexual activity: Never  Other Topics Concern   Not on file  Social History Narrative   Not on file   Social Determinants of Health   Financial Resource Strain: Not on file  Food Insecurity: Not on file  Transportation Needs: Not on file  Physical Activity: Not on file  Stress: Not on file  Social Connections: Not on file  Intimate Partner Violence: Not on file      Objective:    BP 103/66   Pulse 77   Temp 97.9 F (36.6 C)   Resp 20   Ht '5\' 4"'  (1.626 m)   Wt 209 lb (94.8 kg)   SpO2 96%   BMI 35.87 kg/m   Wt Readings from Last 3 Encounters:  06/05/21 209 lb (94.8 kg)  03/04/21 215 lb 12.8 oz (97.9 kg)  01/01/21 216 lb 6.4 oz (98.2 kg)    Physical Exam Vitals reviewed.  Constitutional:      General: He is not in acute distress.    Appearance: Normal appearance. He is obese. He is not ill-appearing, toxic-appearing or diaphoretic.  HENT:     Head: Normocephalic and  atraumatic.     Right Ear: Tympanic membrane, ear canal and external ear normal. There is impacted cerumen.     Left Ear: Tympanic membrane, ear canal and external ear normal. There is impacted cerumen.     Nose: Nose normal. No congestion or rhinorrhea.     Mouth/Throat:     Mouth: Mucous membranes are moist.     Pharynx: Oropharynx is clear. No oropharyngeal exudate or posterior oropharyngeal erythema.  Eyes:     General: No scleral icterus.       Right eye: No discharge.        Left eye: No discharge.     Conjunctiva/sclera: Conjunctivae normal.     Pupils: Pupils are equal, round, and reactive to light.  Neck:     Vascular: No carotid bruit.  Cardiovascular:     Rate and Rhythm: Normal rate and regular rhythm.     Heart sounds: Normal  heart sounds. No murmur heard.   No friction rub. No gallop.  Pulmonary:     Effort: Pulmonary effort is normal. No respiratory distress.     Breath sounds: Normal breath sounds. No stridor. No wheezing, rhonchi or rales.  Abdominal:     General: Abdomen is flat. Bowel sounds are normal. There is no distension.     Palpations: Abdomen is soft. There is no hepatomegaly, splenomegaly or mass.     Tenderness: There is no abdominal tenderness. There is no guarding or rebound.     Hernia: No hernia is present.  Musculoskeletal:        General: Normal range of motion.     Cervical back: Normal range of motion and neck supple. No rigidity. No muscular tenderness.     Right lower leg: No edema.     Left lower leg: No edema.  Lymphadenopathy:     Cervical: No cervical adenopathy.  Skin:    General: Skin is warm and dry.     Capillary Refill: Capillary refill takes less than 2 seconds.  Neurological:     General: No focal deficit present.     Mental Status: He is alert and oriented to person, place, and time. Mental status is at baseline.  Psychiatric:        Mood and Affect: Mood normal.        Behavior: Behavior normal.        Thought Content:  Thought content normal.        Judgment: Judgment normal.    Lab Results  Component Value Date   TSH 2.160 11/19/2020   Lab Results  Component Value Date   WBC 7.9 11/19/2020   HGB 15.4 11/19/2020   HCT 43.7 11/19/2020   MCV 94 11/19/2020   PLT 308 11/19/2020   Lab Results  Component Value Date   NA 140 01/19/2021   K 4.7 01/19/2021   CO2 23 01/19/2021   GLUCOSE 154 (H) 01/19/2021   BUN 14 01/19/2021   CREATININE 0.91 01/19/2021   BILITOT 0.3 11/19/2020   ALKPHOS 67 11/19/2020   AST 29 11/19/2020   ALT 56 (H) 11/19/2020   PROT 7.6 11/19/2020   ALBUMIN 4.9 11/19/2020   CALCIUM 10.0 01/19/2021   ANIONGAP 11 05/21/2020   EGFR 121 01/19/2021   Lab Results  Component Value Date   CHOL 212 (H) 01/19/2021   Lab Results  Component Value Date   HDL 28 (L) 01/19/2021   Lab Results  Component Value Date   LDLCALC 109 (H) 01/19/2021   Lab Results  Component Value Date   TRIG 434 (H) 01/19/2021   Lab Results  Component Value Date   CHOLHDL 7.6 (H) 01/19/2021   Lab Results  Component Value Date   HGBA1C 6.9 01/19/2021

## 2021-06-29 ENCOUNTER — Other Ambulatory Visit: Payer: Self-pay

## 2021-06-29 ENCOUNTER — Other Ambulatory Visit: Payer: Medicaid Other

## 2021-06-29 DIAGNOSIS — F332 Major depressive disorder, recurrent severe without psychotic features: Secondary | ICD-10-CM

## 2021-06-29 DIAGNOSIS — E1165 Type 2 diabetes mellitus with hyperglycemia: Secondary | ICD-10-CM

## 2021-06-29 DIAGNOSIS — Z0000 Encounter for general adult medical examination without abnormal findings: Secondary | ICD-10-CM

## 2021-06-29 DIAGNOSIS — F411 Generalized anxiety disorder: Secondary | ICD-10-CM

## 2021-06-29 DIAGNOSIS — E782 Mixed hyperlipidemia: Secondary | ICD-10-CM

## 2021-06-29 LAB — BAYER DCA HB A1C WAIVED: HB A1C (BAYER DCA - WAIVED): 6.1 % — ABNORMAL HIGH (ref 4.8–5.6)

## 2021-06-30 LAB — CMP14+EGFR
ALT: 47 IU/L — ABNORMAL HIGH (ref 0–44)
AST: 28 IU/L (ref 0–40)
Albumin/Globulin Ratio: 1.8 (ref 1.2–2.2)
Albumin: 4.8 g/dL (ref 4.1–5.2)
Alkaline Phosphatase: 61 IU/L (ref 44–121)
BUN/Creatinine Ratio: 14 (ref 9–20)
BUN: 14 mg/dL (ref 6–20)
Bilirubin Total: 0.4 mg/dL (ref 0.0–1.2)
CO2: 24 mmol/L (ref 20–29)
Calcium: 10.2 mg/dL (ref 8.7–10.2)
Chloride: 100 mmol/L (ref 96–106)
Creatinine, Ser: 1.01 mg/dL (ref 0.76–1.27)
Globulin, Total: 2.7 g/dL (ref 1.5–4.5)
Glucose: 129 mg/dL — ABNORMAL HIGH (ref 65–99)
Potassium: 4.7 mmol/L (ref 3.5–5.2)
Sodium: 141 mmol/L (ref 134–144)
Total Protein: 7.5 g/dL (ref 6.0–8.5)
eGFR: 107 mL/min/{1.73_m2} (ref >59)

## 2021-06-30 LAB — CBC WITH DIFFERENTIAL/PLATELET
Basophils Absolute: 0.1 10*3/uL (ref 0.0–0.2)
Basos: 1 %
EOS (ABSOLUTE): 0.1 10*3/uL (ref 0.0–0.4)
Eos: 2 %
Hematocrit: 43.4 % (ref 37.5–51.0)
Hemoglobin: 14.8 g/dL (ref 13.0–17.7)
Immature Grans (Abs): 0 10*3/uL (ref 0.0–0.1)
Immature Granulocytes: 1 %
Lymphocytes Absolute: 1.9 10*3/uL (ref 0.7–3.1)
Lymphs: 26 %
MCH: 31.4 pg (ref 26.6–33.0)
MCHC: 34.1 g/dL (ref 31.5–35.7)
MCV: 92 fL (ref 79–97)
Monocytes Absolute: 0.5 10*3/uL (ref 0.1–0.9)
Monocytes: 8 %
Neutrophils Absolute: 4.5 10*3/uL (ref 1.4–7.0)
Neutrophils: 62 %
Platelets: 281 10*3/uL (ref 150–450)
RBC: 4.72 x10E6/uL (ref 4.14–5.80)
RDW: 12.6 % (ref 11.6–15.4)
WBC: 7.2 10*3/uL (ref 3.4–10.8)

## 2021-06-30 LAB — LIPID PANEL
Chol/HDL Ratio: 6.9 ratio — ABNORMAL HIGH (ref 0.0–5.0)
Cholesterol, Total: 193 mg/dL (ref 100–199)
HDL: 28 mg/dL — ABNORMAL LOW (ref >39)
LDL Chol Calc (NIH): 119 mg/dL — ABNORMAL HIGH (ref 0–99)
Triglycerides: 260 mg/dL — ABNORMAL HIGH (ref 0–149)
VLDL Cholesterol Cal: 46 mg/dL — ABNORMAL HIGH (ref 5–40)

## 2021-07-07 ENCOUNTER — Ambulatory Visit (INDEPENDENT_AMBULATORY_CARE_PROVIDER_SITE_OTHER): Payer: Medicaid Other | Admitting: Family Medicine

## 2021-07-07 ENCOUNTER — Encounter: Payer: Self-pay | Admitting: Family Medicine

## 2021-07-07 ENCOUNTER — Other Ambulatory Visit: Payer: Self-pay

## 2021-07-07 ENCOUNTER — Telehealth: Payer: Self-pay | Admitting: Family Medicine

## 2021-07-07 VITALS — BP 125/77 | HR 99 | Temp 98.0°F | Ht 64.0 in | Wt 209.8 lb

## 2021-07-07 DIAGNOSIS — F332 Major depressive disorder, recurrent severe without psychotic features: Secondary | ICD-10-CM | POA: Diagnosis not present

## 2021-07-07 DIAGNOSIS — R454 Irritability and anger: Secondary | ICD-10-CM

## 2021-07-07 DIAGNOSIS — F411 Generalized anxiety disorder: Secondary | ICD-10-CM

## 2021-07-07 MED ORDER — QUETIAPINE FUMARATE ER 50 MG PO TB24: ORAL_TABLET | ORAL | 0 refills | Status: DC

## 2021-07-07 MED ORDER — ESCITALOPRAM OXALATE 20 MG PO TABS: 20.0000 mg | ORAL_TABLET | Freq: Every day | ORAL | 1 refills | Status: DC

## 2021-07-07 NOTE — Progress Notes (Signed)
Assessment & Plan:  1. Difficulty controlling anger Uncontrolled.  Seroquel changed to XR and increase to 50 mg x 1 week with directions to increase to 100 mg at that point.  Patient may very well need to increase again to 200 mg, but I would like to do a follow-up appointment prior to that increase.  I will reach out to our referral coordinator regarding the referral to psychiatry. - QUEtiapine (SEROQUEL XR) 50 MG TB24 24 hr tablet; Take 50 mg (1 tablet) by mouth at bedtime x1 week, then increase to 100 mg (2 tablets) at bedtime.  Dispense: 30 tablet; Refill: 0  2. Generalized anxiety disorder I will reach out to our referral coordinator regarding the referral to psychiatry. - escitalopram (LEXAPRO) 20 MG tablet; Take 1 tablet (20 mg total) by mouth daily.  Dispense: 90 tablet; Refill: 1  3. Severe episode of recurrent major depressive disorder, without psychotic features ([place]) I will reach out to our referral coordinator regarding the referral to psychiatry. - escitalopram (LEXAPRO) 20 MG tablet; Take 1 tablet (20 mg total) by mouth daily.  Dispense: 90 tablet; Refill: 1   Return in about 2 weeks (around 07/21/2021) for Anger.  Hendricks Limes, MSN, APRN, FNP-C [facility]  Subjective:    Patient ID: Peter Bradshaw, male    DOB: [birth year], 25 y.o.   MRN: [id number]  Patient Care Team: Loman Brooklyn, FNP as PCP - General (Family Medicine)   Chief Complaint:  Chief Complaint  Patient presents with   anger    4 week follow up- patient states that his anger has gotten worse.     HPI: Peter Bradshaw is a 25 y.o. male presenting on 07/07/2021 for anger (4 week follow up- patient states that his anger has gotten worse. )  Patient is accompanied by his guardian.  Patient was started on Seroquel 25 mg at bedtime to help with his anger.  He feels like it is getting worse.  A referral was also placed to a psychiatrist, which they have not heard from.  New  complaints: None  Social history:  Relevant past medical, surgical, family and social history reviewed and updated as indicated. Interim medical history since our last visit reviewed.  Allergies and medications reviewed and updated.  DATA REVIEWED: CHART IN EPIC  ROS: Negative unless specifically indicated above in HPI.    Current Outpatient Medications:    escitalopram (LEXAPRO) 20 MG tablet, Take 1 tablet (20 mg total) by mouth daily., Disp: 90 tablet, Rfl: 1   Multiple Vitamins-Minerals (MULTIVITAMIN WITH MINERALS) tablet, Take 1 tablet by mouth daily., Disp: , Rfl:    Omega-3 Fatty Acids (FISH OIL) 1000 MG CAPS, Take 1,000 mg by mouth 2 (two) times daily., Disp: , Rfl:    polyethylene glycol (MIRALAX / GLYCOLAX) 17 g packet, Take 17 g by mouth daily as needed., Disp: , Rfl:    propranolol ER (INDERAL LA) 80 MG 24 hr capsule, Take 1 capsule (80 mg total) by mouth daily., Disp: 90 capsule, Rfl: 1   QUEtiapine (SEROQUEL) 25 MG tablet, Take 1 tablet (25 mg total) by mouth at bedtime., Disp: 30 tablet, Rfl: 2   No Known Allergies Past Medical History:  Diagnosis Date   ADHD (attention deficit hyperactivity disorder)    Anxiety    Autism    Autistic behavior    Cognitive impairment    Developmental delay    Diabetes mellitus, type 2 ([place]) 01/21/2021   Hypertension  Mental developmental delay    Mixed hyperlipidemia 01/20/2021    History reviewed. No pertinent surgical history.  Social History   Socioeconomic History   Marital status: Single    Spouse name: Not on file   Number of children: Not on file   Years of education: Not on file   Highest education level: Not on file  Occupational History   Not on file  Tobacco Use   Smoking status: Never   Smokeless tobacco: Never  Vaping Use   Vaping Use: Never used  Substance and Sexual Activity   Alcohol use: Never   Drug use: Never   Sexual activity: Never  Other Topics Concern   Not on file  Social History  Narrative   Not on file   Social Determinants of Health   Financial Resource Strain: Not on file  Food Insecurity: Not on file  Transportation Needs: Not on file  Physical Activity: Not on file  Stress: Not on file  Social Connections: Not on file  Intimate Partner Violence: Not on file        Objective:    BP 125/77   Pulse 99   Temp 98 F (36.7 C) (Temporal)   Ht '5\' 4"'  (1.626 m)   Wt 209 lb 12.8 oz (95.2 kg)   BMI 36.01 kg/m   Wt Readings from Last 3 Encounters:  07/07/21 209 lb 12.8 oz (95.2 kg)  06/05/21 209 lb (94.8 kg)  03/04/21 215 lb 12.8 oz (97.9 kg)    Physical Exam Vitals reviewed.  Constitutional:      General: He is not in acute distress.    Appearance: Normal appearance. He is obese. He is not ill-appearing, toxic-appearing or diaphoretic.  HENT:     Head: Normocephalic and atraumatic.  Eyes:     General: No scleral icterus.       Right eye: No discharge.        Left eye: No discharge.     Conjunctiva/sclera: Conjunctivae normal.  Cardiovascular:     Rate and Rhythm: Normal rate.  Pulmonary:     Effort: Pulmonary effort is normal. No respiratory distress.  Musculoskeletal:        General: Normal range of motion.     Cervical back: Normal range of motion.  Skin:    General: Skin is warm and dry.  Neurological:     Mental Status: He is alert and oriented to person, place, and time. Mental status is at baseline.  Psychiatric:        Mood and Affect: Mood normal.        Behavior: Behavior normal.        Thought Content: Thought content normal.        Judgment: Judgment normal.    Lab Results  Component Value Date   TSH 2.160 11/19/2020   Lab Results  Component Value Date   WBC 7.2 06/29/2021   HGB 14.8 06/29/2021   HCT 43.4 06/29/2021   MCV 92 06/29/2021   PLT 281 06/29/2021   Lab Results  Component Value Date   NA 141 06/29/2021   K 4.7 06/29/2021   CO2 24 06/29/2021   GLUCOSE 129 (H) 06/29/2021   BUN 14 06/29/2021    CREATININE 1.01 06/29/2021   BILITOT 0.4 06/29/2021   ALKPHOS 61 06/29/2021   AST 28 06/29/2021   ALT 47 (H) 06/29/2021   PROT 7.5 06/29/2021   ALBUMIN 4.8 06/29/2021   CALCIUM 10.2 06/29/2021   ANIONGAP 11 05/21/2020  EGFR 107 06/29/2021   Lab Results  Component Value Date   CHOL 193 06/29/2021   Lab Results  Component Value Date   HDL 28 (L) 06/29/2021   Lab Results  Component Value Date   LDLCALC 119 (H) 06/29/2021   Lab Results  Component Value Date   TRIG 260 (H) 06/29/2021   Lab Results  Component Value Date   CHOLHDL 6.9 (H) 06/29/2021   Lab Results  Component Value Date   HGBA1C 6.1 (H) 06/29/2021

## 2021-07-07 NOTE — Telephone Encounter (Signed)
Patient nor his guardian have heard from the psychiatry referral.  Can we please check into this?

## 2021-07-22 ENCOUNTER — Other Ambulatory Visit: Payer: Self-pay | Admitting: Family Medicine

## 2021-07-22 ENCOUNTER — Encounter: Payer: Self-pay | Admitting: Family Medicine

## 2021-07-22 ENCOUNTER — Other Ambulatory Visit: Payer: Self-pay

## 2021-07-22 ENCOUNTER — Ambulatory Visit (INDEPENDENT_AMBULATORY_CARE_PROVIDER_SITE_OTHER): Payer: Medicaid Other | Admitting: Family Medicine

## 2021-07-22 DIAGNOSIS — R454 Irritability and anger: Secondary | ICD-10-CM

## 2021-07-22 MED ORDER — QUETIAPINE FUMARATE ER 200 MG PO TB24: 200.0000 mg | ORAL_TABLET | Freq: Every day | ORAL | 2 refills | Status: DC

## 2021-07-22 NOTE — Progress Notes (Deleted)
Assessment & Plan:  ***  No follow-ups on file.  Hendricks Limes, MSN, APRN, FNP-C [facility]  Subjective:    Patient ID: Peter Bradshaw, male    DOB: 03-11-1996, 25 y.o.   MRN: [id number]  Patient Care Team: Loman Brooklyn, FNP as PCP - General (Family Medicine)   Chief Complaint:  Chief Complaint  Patient presents with   anger    2 week follow up     HPI: Peter Bradshaw is a 25 y.o. male presenting on 07/22/2021 for anger (2 week follow up )    New complaints: ***  Social history:  Relevant past medical, surgical, family and social history reviewed and updated as indicated. Interim medical history since our last visit reviewed.  Allergies and medications reviewed and updated.  DATA REVIEWED: CHART IN EPIC  ROS: Negative unless specifically indicated above in HPI.    Current Outpatient Medications:    escitalopram (LEXAPRO) 20 MG tablet, Take 1 tablet (20 mg total) by mouth daily., Disp: 90 tablet, Rfl: 1   Multiple Vitamins-Minerals (MULTIVITAMIN WITH MINERALS) tablet, Take 1 tablet by mouth daily., Disp: , Rfl:    Omega-3 Fatty Acids (FISH OIL) 1000 MG CAPS, Take 1,000 mg by mouth 2 (two) times daily., Disp: , Rfl:    polyethylene glycol (MIRALAX / GLYCOLAX) 17 g packet, Take 17 g by mouth daily as needed., Disp: , Rfl:    propranolol ER (INDERAL LA) 80 MG 24 hr capsule, Take 1 capsule (80 mg total) by mouth daily., Disp: 90 capsule, Rfl: 1   QUEtiapine (SEROQUEL XR) 50 MG TB24 24 hr tablet, Take 50 mg (1 tablet) by mouth at bedtime x1 week, then increase to 100 mg (2 tablets) at bedtime. (Patient taking differently: Take 100 mg by mouth daily. Take 50 mg (1 tablet) by mouth at bedtime x1 week, then increase to 100 mg (2 tablets) at bedtime.), Disp: 30 tablet, Rfl: 0   No Known Allergies Past Medical History:  Diagnosis Date   ADHD (attention deficit hyperactivity disorder)    Anxiety    Autism    Autistic behavior    Cognitive impairment     Developmental delay    Diabetes mellitus, type 2 ([place]) 01/21/2021   Hypertension    Mental developmental delay    Mixed hyperlipidemia 01/20/2021    History reviewed. No pertinent surgical history.  Social History   Socioeconomic History   Marital status: Single    Spouse name: Not on file   Number of children: Not on file   Years of education: Not on file   Highest education level: Not on file  Occupational History   Not on file  Tobacco Use   Smoking status: Never   Smokeless tobacco: Never  Vaping Use   Vaping Use: Never used  Substance and Sexual Activity   Alcohol use: Never   Drug use: Never   Sexual activity: Never  Other Topics Concern   Not on file  Social History Narrative   Not on file   Social Determinants of Health   Financial Resource Strain: Not on file  Food Insecurity: Not on file  Transportation Needs: Not on file  Physical Activity: Not on file  Stress: Not on file  Social Connections: Not on file  Intimate Partner Violence: Not on file        Objective:    BP 126/85   Pulse (!) 120   Temp 97.8 F (36.6 C) (Temporal)  Ht '5\' 4"'  (1.626 m)   Wt 211 lb (95.7 kg)   SpO2 94%   BMI 36.22 kg/m   Wt Readings from Last 3 Encounters:  07/22/21 211 lb (95.7 kg)  07/07/21 209 lb 12.8 oz (95.2 kg)  06/05/21 209 lb (94.8 kg)    Physical Exam  Lab Results  Component Value Date   TSH 2.160 11/19/2020   Lab Results  Component Value Date   WBC 7.2 06/29/2021   HGB 14.8 06/29/2021   HCT 43.4 06/29/2021   MCV 92 06/29/2021   PLT 281 06/29/2021   Lab Results  Component Value Date   NA 141 06/29/2021   K 4.7 06/29/2021   CO2 24 06/29/2021   GLUCOSE 129 (H) 06/29/2021   BUN 14 06/29/2021   CREATININE 1.01 06/29/2021   BILITOT 0.4 06/29/2021   ALKPHOS 61 06/29/2021   AST 28 06/29/2021   ALT 47 (H) 06/29/2021   PROT 7.5 06/29/2021   ALBUMIN 4.8 06/29/2021   CALCIUM 10.2 06/29/2021   ANIONGAP 11 05/21/2020   EGFR 107 06/29/2021    Lab Results  Component Value Date   CHOL 193 06/29/2021   Lab Results  Component Value Date   HDL 28 (L) 06/29/2021   Lab Results  Component Value Date   LDLCALC 119 (H) 06/29/2021   Lab Results  Component Value Date   TRIG 260 (H) 06/29/2021   Lab Results  Component Value Date   CHOLHDL 6.9 (H) 06/29/2021   Lab Results  Component Value Date   HGBA1C 6.1 (H) 06/29/2021

## 2021-07-22 NOTE — Progress Notes (Signed)
Assessment & Plan:  1. Difficulty controlling anger - increasing Seroquel dose from 100 mg to 200 mg - QUEtiapine (SEROQUEL XR) 200 MG 24 hr tablet; Take 1 tablet (200 mg total) by mouth at bedtime.  Dispense: 30 tablet; Refill: 2 - confirmed psychiatry has received referral, they will call and schedule appointment; # provided to guardian so that he can call them and follow-up regarding scheduling   Return in about 4 weeks (around 08/19/2021) for Anger.  Lucile Crater, NP Student  I personally was present during the history, physical exam, and medical decision-making activities of this service and have verified that the service and findings are accurately documented in the nurse practitioner student's note.  Hendricks Limes, MSN, APRN, FNP-C [facility]   Subjective:    Patient ID: Peter Bradshaw, male    DOB: [birth year], 25 y.o.   MRN: [id number]  Patient Care Team: Loman Brooklyn, FNP as PCP - General (Family Medicine)   Chief Complaint:  Chief Complaint  Patient presents with   anger    2 week follow up     HPI: Peter Bradshaw is a 25 y.o. male presenting on 07/22/2021 for anger (2 week follow up )  Patient is accompanied by his guardian.  Patient was increased from 25 mg to 50 mg to 100 mg of Seroquel at bedtime to help with his anger.  Patient is not sure if it is helping.  A referral was also placed to a psychiatrist, which they have not heard from. His guardian states that he feels the medicine is making some improvement in his anger, but he's not where he would like him to be. The patient states he is sleeping more than usual and his sleep schedule is altered.   New complaints: None  Social history:  Relevant past medical, surgical, family and social history reviewed and updated as indicated. Interim medical history since our last visit reviewed.  Allergies and medications reviewed and updated.  DATA REVIEWED: CHART IN EPIC  ROS: Negative unless  specifically indicated above in HPI.    Current Outpatient Medications:    escitalopram (LEXAPRO) 20 MG tablet, Take 1 tablet (20 mg total) by mouth daily., Disp: 90 tablet, Rfl: 1   Multiple Vitamins-Minerals (MULTIVITAMIN WITH MINERALS) tablet, Take 1 tablet by mouth daily., Disp: , Rfl:    Omega-3 Fatty Acids (FISH OIL) 1000 MG CAPS, Take 1,000 mg by mouth 2 (two) times daily., Disp: , Rfl:    polyethylene glycol (MIRALAX / GLYCOLAX) 17 g packet, Take 17 g by mouth daily as needed., Disp: , Rfl:    propranolol ER (INDERAL LA) 80 MG 24 hr capsule, Take 1 capsule (80 mg total) by mouth daily., Disp: 90 capsule, Rfl: 1   QUEtiapine (SEROQUEL XR) 200 MG 24 hr tablet, Take 1 tablet (200 mg total) by mouth at bedtime., Disp: 30 tablet, Rfl: 2   No Known Allergies Past Medical History:  Diagnosis Date   ADHD (attention deficit hyperactivity disorder)    Anxiety    Autism    Autistic behavior    Cognitive impairment    Developmental delay    Diabetes mellitus, type 2 ([place]) 01/21/2021   Hypertension    Mental developmental delay    Mixed hyperlipidemia 01/20/2021    History reviewed. No pertinent surgical history.  Social History   Socioeconomic History   Marital status: Single    Spouse name: Not on file   Number of children: Not on file  Years of education: Not on file   Highest education level: Not on file  Occupational History   Not on file  Tobacco Use   Smoking status: Never   Smokeless tobacco: Never  Vaping Use   Vaping Use: Never used  Substance and Sexual Activity   Alcohol use: Never   Drug use: Never   Sexual activity: Never  Other Topics Concern   Not on file  Social History Narrative   Not on file   Social Determinants of Health   Financial Resource Strain: Not on file  Food Insecurity: Not on file  Transportation Needs: Not on file  Physical Activity: Not on file  Stress: Not on file  Social Connections: Not on file  Intimate Partner Violence: Not  on file        Objective:    BP 126/85   Pulse (!) 120   Temp 97.8 F (36.6 C) (Temporal)   Ht 5' 4" (1.626 m)   Wt 95.7 kg   SpO2 94%   BMI 36.22 kg/m   Wt Readings from Last 3 Encounters:  07/22/21 211 lb (95.7 kg)  07/07/21 209 lb 12.8 oz (95.2 kg)  06/05/21 209 lb (94.8 kg)    Physical Exam Vitals reviewed.  Constitutional:      General: He is not in acute distress.    Appearance: Normal appearance. He is obese. He is not ill-appearing, toxic-appearing or diaphoretic.  HENT:     Head: Normocephalic and atraumatic.  Eyes:     General: No scleral icterus.       Right eye: No discharge.        Left eye: No discharge.     Conjunctiva/sclera: Conjunctivae normal.  Cardiovascular:     Rate and Rhythm: Normal rate.  Pulmonary:     Effort: Pulmonary effort is normal. No respiratory distress.  Musculoskeletal:        General: Normal range of motion.     Cervical back: Normal range of motion.  Skin:    General: Skin is warm and dry.  Neurological:     Mental Status: He is alert and oriented to person, place, and time. Mental status is at baseline.  Psychiatric:        Mood and Affect: Mood normal.        Behavior: Behavior normal.        Thought Content: Thought content normal.        Judgment: Judgment normal.    Lab Results  Component Value Date   TSH 2.160 11/19/2020   Lab Results  Component Value Date   WBC 7.2 06/29/2021   HGB 14.8 06/29/2021   HCT 43.4 06/29/2021   MCV 92 06/29/2021   PLT 281 06/29/2021   Lab Results  Component Value Date   NA 141 06/29/2021   K 4.7 06/29/2021   CO2 24 06/29/2021   GLUCOSE 129 (H) 06/29/2021   BUN 14 06/29/2021   CREATININE 1.01 06/29/2021   BILITOT 0.4 06/29/2021   ALKPHOS 61 06/29/2021   AST 28 06/29/2021   ALT 47 (H) 06/29/2021   PROT 7.5 06/29/2021   ALBUMIN 4.8 06/29/2021   CALCIUM 10.2 06/29/2021   ANIONGAP 11 05/21/2020   EGFR 107 06/29/2021   Lab Results  Component Value Date   CHOL 193  06/29/2021   Lab Results  Component Value Date   HDL 28 (L) 06/29/2021   Lab Results  Component Value Date   LDLCALC 119 (H) 06/29/2021   Lab Results  Component Value Date   TRIG 260 (H) 06/29/2021   Lab Results  Component Value Date   CHOLHDL 6.9 (H) 06/29/2021   Lab Results  Component Value Date   HGBA1C 6.1 (H) 06/29/2021

## 2021-07-23 ENCOUNTER — Other Ambulatory Visit: Payer: Self-pay | Admitting: Family Medicine

## 2021-07-23 DIAGNOSIS — R454 Irritability and anger: Secondary | ICD-10-CM

## 2021-07-24 ENCOUNTER — Telehealth: Payer: Self-pay | Admitting: Family Medicine

## 2021-07-26 ENCOUNTER — Encounter: Payer: Self-pay | Admitting: Family Medicine

## 2021-07-27 NOTE — Telephone Encounter (Signed)
CALLED AND THEY ARE ASKING WHERE THE PROCESS IS ON THE PA WHEN IS IT GOING TO BE DONE?

## 2021-07-27 NOTE — Telephone Encounter (Signed)
PA completed for QUEtiapine XR 200mg  Confirmation WPrior Approval #:[id number] Status:APPROVED 07/27/2021 - 07/27/2022 Pharmacy aware and detailed message left for patients emergency contact.

## 2021-08-19 ENCOUNTER — Other Ambulatory Visit: Payer: Self-pay

## 2021-08-19 ENCOUNTER — Ambulatory Visit: Payer: Medicaid Other | Admitting: Family Medicine

## 2021-08-19 ENCOUNTER — Encounter: Payer: Self-pay | Admitting: Family Medicine

## 2021-08-19 VITALS — BP 100/64 | HR 64 | Temp 97.9°F | Ht 64.0 in | Wt 212.6 lb

## 2021-08-19 DIAGNOSIS — F840 Autistic disorder: Secondary | ICD-10-CM

## 2021-08-19 DIAGNOSIS — F411 Generalized anxiety disorder: Secondary | ICD-10-CM | POA: Diagnosis not present

## 2021-08-19 DIAGNOSIS — R454 Irritability and anger: Secondary | ICD-10-CM

## 2021-08-19 DIAGNOSIS — F332 Major depressive disorder, recurrent severe without psychotic features: Secondary | ICD-10-CM | POA: Diagnosis not present

## 2021-08-19 MED ORDER — PROPRANOLOL HCL ER 80 MG PO CP24: 80.0000 mg | ORAL_CAPSULE | Freq: Every day | ORAL | 1 refills | Status: DC

## 2021-08-19 NOTE — Progress Notes (Signed)
Assessment & Plan:  1. Difficulty controlling anger - uncontrolled with Seroquel and Lexapro - Ambulatory referral to Psychiatry  2. Autism spectrum disorder - Ambulatory referral to Psychiatry  3-4. 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  Lucile Crater, NP Student   I personally was present during the history, physical exam, and medical decision-making activities of this service and have verified that the service and findings are accurately documented in the nurse practitioner student's note.  Hendricks Limes, MSN, APRN, FNP-C [facility]   Subjective:    Patient ID: Peter Bradshaw, male    DOB: May 30, 1996, 25 y.o.   MRN: [id number]  Patient Care Team: Loman Brooklyn, FNP as PCP - General (Family Medicine)   Chief Complaint:  Chief Complaint  Patient presents with   anger    4 week follow up- no change     HPI: Peter Bradshaw is a 25 y.o. male presenting on 08/19/2021 for anger (4 week follow up- no change )  Patient is accompanied by his guardian.  Patient was increased from 100 mg of Seroquel to 200 mg at bedtime to help with his anger. Patient and guardian state this is not helping with anger, but is making him very sleepy. He reports episodes of sleeping standing up. A referral was placed to a psychiatrist, which was denied due to lack of available provider in [place]. He states he has previously tried cognitive behavioral therapy in the past and it did not help. His guardian stated at the last visit that the Seroquel was helping somewhat, but the increased dose is just keeping him sedated, which they both agree is not a good quality of life.   He has been on Lexapro for depression and anxiety but  is not well controlled. He and his guardian are interested in [place] testing to determine if another medication may be more effective.   Depression screen [facility] 2/9 08/19/2021 07/22/2021 07/07/2021  Decreased Interest _0 Down, Depressed, Hopeless _1 PHQ - 2 Score _2 Altered sleeping _3 Tired, decreased energy _4 Change in appetite 2 0 0  Feeling bad or failure about yourself  _5 Trouble concentrating _6 Moving slowly or fidgety/restless 0 0 0  Suicidal thoughts 0 0 0  PHQ-9 Score _7 Difficult doing work/chores Somewhat difficult - -  Some recent data might be hidden   GAD 7 : Generalized Anxiety Score 08/19/2021 07/22/2021 07/07/2021 06/05/2021  Nervous, Anxious, on Edge _8 Control/stop worrying 0 _9 Worry too much - different things 0 _10 Trouble relaxing _11 Restless _12 Easily annoyed or irritable _13 Afraid - awful might happen 0 1 1 0  Total GAD 7 Score _14 Anxiety Difficulty Somewhat difficult - - Not difficult at all    New complaints: None  Social history:  Relevant past medical, surgical, family and social history reviewed and updated as indicated. Interim medical history since our last visit reviewed.  Allergies and medications reviewed and updated.  DATA REVIEWED: CHART IN EPIC  ROS: Negative unless specifically indicated above in HPI.    Current Outpatient Medications:    escitalopram (LEXAPRO) 20 MG tablet, Take 1 tablet (20 mg total) by mouth daily., Disp: 90 tablet, Rfl: 1   Multiple Vitamins-Minerals (MULTIVITAMIN WITH MINERALS) tablet, Take 1 tablet by mouth daily., Disp: , Rfl:    Omega-3 Fatty Acids (FISH OIL) 1000 MG CAPS, Take 1,000 mg by mouth 2 (two) times daily., Disp: , Rfl:    polyethylene glycol (MIRALAX / GLYCOLAX) 17 g packet, Take 17 g by mouth daily as needed., Disp: , Rfl:    QUEtiapine (SEROQUEL XR) 200 MG 24 hr tablet, TAKE 1 TABLET BY MOUTH AT BEDTIME., Disp: 30 tablet,  Rfl: 2   propranolol ER (INDERAL LA) 80 MG 24 hr capsule, Take 1 capsule (80 mg total) by mouth daily., Disp: 90 capsule, Rfl: 1   No Known Allergies Past Medical History:  Diagnosis Date   ADHD (attention deficit hyperactivity disorder)    Anxiety    Autism    Autistic behavior    Cognitive impairment    Developmental delay    Diabetes mellitus, type 2 ([place]) 01/21/2021   Hypertension    Mental developmental delay    Mixed hyperlipidemia 01/20/2021    History reviewed. No pertinent surgical history.  Social History   Socioeconomic History   Marital status: Single    Spouse name: Not on file   Number of children: Not on file   Years of education: Not on file   Highest education level: Not on file  Occupational History   Not on file  Tobacco Use   Smoking status: Never   Smokeless tobacco: Never  Vaping Use   Vaping Use: Never used  Substance and Sexual Activity   Alcohol use: Never   Drug use: Never   Sexual activity: Never  Other Topics Concern   Not on file  Social History Narrative   Not on file   Social Determinants of Health   Financial Resource Strain: Not on file  Food Insecurity: Not on file  Transportation Needs: Not on file  Physical Activity: Not on file  Stress: Not on file  Social Connections: Not on file  Intimate Partner Violence: Not on file        Objective:    BP 100/64   Pulse 64   Temp 97.9 F (36.6 C) (Temporal)   Ht 5' 4" (1.626 m)   Wt 96.4 kg   SpO2 95%   BMI 36.49 kg/m   Wt Readings from Last 3 Encounters:  08/19/21 212 lb 9.6 oz (96.4 kg)  07/22/21 211 lb (95.7 kg)  07/07/21 209 lb 12.8 oz (95.2 kg)    Physical Exam Vitals reviewed.  Constitutional:      General: He is not in acute distress.    Appearance: Normal appearance. He is obese. He is not ill-appearing, toxic-appearing or diaphoretic.  HENT:     Head: Normocephalic and atraumatic.  Eyes:     General: No scleral icterus.       Right eye: No discharge.         Left eye: No discharge.     Conjunctiva/sclera: Conjunctivae normal.  Cardiovascular:     Rate and Rhythm: Normal rate.  Pulmonary:     Effort: Pulmonary effort is normal. No respiratory distress.  Musculoskeletal:        General: Normal range of motion.     Cervical back: Normal range of motion.  Skin:    General:  Skin is warm and dry.  Neurological:     Mental Status: He is alert and oriented to person, place, and time. Mental status is at baseline.  Psychiatric:        Mood and Affect: Mood normal.        Behavior: Behavior normal.        Thought Content: Thought content normal.        Judgment: Judgment normal.    Lab Results  Component Value Date   TSH 2.160 11/19/2020   Lab Results  Component Value Date   WBC 7.2 06/29/2021   HGB 14.8 06/29/2021   HCT 43.4 06/29/2021   MCV 92 06/29/2021   PLT 281 06/29/2021   Lab Results  Component Value Date   NA 141 06/29/2021   K 4.7 06/29/2021   CO2 24 06/29/2021   GLUCOSE 129 (H) 06/29/2021   BUN 14 06/29/2021   CREATININE 1.01 06/29/2021   BILITOT 0.4 06/29/2021   ALKPHOS 61 06/29/2021   AST 28 06/29/2021   ALT 47 (H) 06/29/2021   PROT 7.5 06/29/2021   ALBUMIN 4.8 06/29/2021   CALCIUM 10.2 06/29/2021   ANIONGAP 11 05/21/2020   EGFR 107 06/29/2021   Lab Results  Component Value Date   CHOL 193 06/29/2021   Lab Results  Component Value Date   HDL 28 (L) 06/29/2021   Lab Results  Component Value Date   LDLCALC 119 (H) 06/29/2021   Lab Results  Component Value Date   TRIG 260 (H) 06/29/2021   Lab Results  Component Value Date   CHOLHDL 6.9 (H) 06/29/2021   Lab Results  Component Value Date   HGBA1C 6.1 (H) 06/29/2021

## 2021-08-21 MED ORDER — QUETIAPINE FUMARATE ER 50 MG PO TB24: ORAL_TABLET | ORAL | 0 refills | Status: DC

## 2021-08-24 ENCOUNTER — Encounter: Payer: Self-pay | Admitting: Family Medicine

## 2021-08-25 ENCOUNTER — Telehealth: Payer: Self-pay | Admitting: Family Medicine

## 2021-08-25 DIAGNOSIS — F411 Generalized anxiety disorder: Secondary | ICD-10-CM

## 2021-08-25 DIAGNOSIS — F332 Major depressive disorder, recurrent severe without psychotic features: Secondary | ICD-10-CM

## 2021-08-25 DIAGNOSIS — R454 Irritability and anger: Secondary | ICD-10-CM

## 2021-08-25 MED ORDER — FLUOXETINE HCL 20 MG PO CAPS: 20.0000 mg | ORAL_CAPSULE | Freq: Every day | ORAL | 2 refills | Status: DC

## 2021-08-25 NOTE — Telephone Encounter (Signed)
Lmtcb.

## 2021-08-25 NOTE — Telephone Encounter (Signed)
Aware and verbalizes understanding.  

## 2021-08-25 NOTE — Telephone Encounter (Signed)
Please let patient and guardian know I received his [facility] test results. I am sending him in a prescription for Prozac. He can do a direct switch from Lexapro over to Prozac.

## 2021-10-20 ENCOUNTER — Encounter: Payer: Self-pay | Admitting: Family Medicine

## 2021-10-20 ENCOUNTER — Ambulatory Visit (INDEPENDENT_AMBULATORY_CARE_PROVIDER_SITE_OTHER): Payer: Medicaid Other | Admitting: Family Medicine

## 2021-10-20 VITALS — BP 117/75 | HR 91 | Temp 97.7°F | Ht 64.0 in | Wt 208.4 lb

## 2021-10-20 DIAGNOSIS — F840 Autistic disorder: Secondary | ICD-10-CM | POA: Diagnosis not present

## 2021-10-20 DIAGNOSIS — E669 Obesity, unspecified: Secondary | ICD-10-CM | POA: Insufficient documentation

## 2021-10-20 DIAGNOSIS — R454 Irritability and anger: Secondary | ICD-10-CM | POA: Diagnosis not present

## 2021-10-20 DIAGNOSIS — F411 Generalized anxiety disorder: Secondary | ICD-10-CM | POA: Diagnosis not present

## 2021-10-20 DIAGNOSIS — F332 Major depressive disorder, recurrent severe without psychotic features: Secondary | ICD-10-CM | POA: Diagnosis not present

## 2021-10-20 MED ORDER — FLUOXETINE HCL 40 MG PO CAPS: 40.0000 mg | ORAL_CAPSULE | Freq: Every day | ORAL | 2 refills | Status: DC

## 2021-10-20 NOTE — Progress Notes (Signed)
Assessment & Plan:  1. Severe episode of recurrent major depressive disorder, without psychotic features ([place]) Improving. Prozac increased from 20 mg to 40 mg daily. - FLUoxetine (PROZAC) 40 MG capsule; Take 1 capsule (40 mg total) by mouth daily.  Dispense: 30 capsule; Refill: 2  2. Generalized anxiety disorder Improving. Prozac increased from 20 mg to 40 mg daily. - FLUoxetine (PROZAC) 40 MG capsule; Take 1 capsule (40 mg total) by mouth daily.  Dispense: 30 capsule; Refill: 2  3. Difficulty controlling anger Uncontrolled. Referring to psychiatry again.  - FLUoxetine (PROZAC) 40 MG capsule; Take 1 capsule (40 mg total) by mouth daily.  Dispense: 30 capsule; Refill: 2 - Ambulatory referral to Psychiatry  4. Autism spectrum disorder Referring to psychiatry again.  - Ambulatory referral to Psychiatry  5. Obesity (BMI 35.0-39.9 without comorbidity) Patient has been trying to eat more healthy by going to [place]. He has lost 4 lbs in the past 2 months. Healthy eating and exercise encouraged.   Return in about 2 months (around 12/18/2021) for follow-up of chronic medication conditions.  Hendricks Limes, MSN, APRN, FNP-C [facility]  Subjective:    Patient ID: Peter Bradshaw, male    DOB: 01-07-1996, 26 y.o.   MRN: [id number]  Patient Care Team: Loman Brooklyn, FNP as PCP - General (Family Medicine)   Chief Complaint:  Chief Complaint  Patient presents with   anger    2 month follow up     HPI: Peter Bradshaw is a 26 y.o. male presenting on 10/20/2021 for anger (2 month follow up )  Patient is accompanied by his guardian.  Patient was started on Prozac ~2 months ago after [employer] testing, which he reports is somewhat helpful for his depression and anxiety, but not at all helpful for his anger. He has previously failed treatment with Lexapro and Seroquel. He has been referred to psychiatry in August and November; unfortunately both referrals were closed out by  the referring office and he still does not have an appointment to establish care.   Depression screen [facility] 2/9 10/20/2021 08/19/2021 07/22/2021  Decreased Interest '2 3 2  ' Down, Depressed, Hopeless 0 2 2  PHQ - 2 Score '2 5 4  ' Altered sleeping 0 3 3  Tired, decreased energy '2 3 2  ' Change in appetite 0 2 0  Feeling bad or failure about yourself  '1 2 2  ' Trouble concentrating '1 2 2  ' Moving slowly or fidgety/restless 0 0 0  Suicidal thoughts 0 0 0  PHQ-9 Score '6 17 13  ' Difficult doing work/chores Not difficult at all Somewhat difficult -  Some recent data might be hidden   GAD 7 : Generalized Anxiety Score 10/20/2021 08/19/2021 07/22/2021 07/07/2021  Nervous, Anxious, on Edge '1 3 3 2  ' Control/stop worrying 1 0 2 2  Worry too much - different things 1 0 2 2  Trouble relaxing '1 3 2 3  ' Restless '2 2 3 3  ' Easily annoyed or irritable '2 3 3 3  ' Afraid - awful might happen 2 0 1 1  Total GAD 7 Score '10 11 16 16  ' Anxiety Difficulty Not difficult at all Somewhat difficult - -    New complaints: None  Social history:  Relevant past medical, surgical, family and social history reviewed and updated as indicated. Interim medical history since our last visit reviewed.  Allergies and medications reviewed and updated.  DATA REVIEWED: CHART IN EPIC  ROS: Negative unless specifically indicated  above in HPI.    Current Outpatient Medications:    FLUoxetine (PROZAC) 20 MG capsule, Take 1 capsule (20 mg total) by mouth daily., Disp: 30 capsule, Rfl: 2   Multiple Vitamins-Minerals (MULTIVITAMIN WITH MINERALS) tablet, Take 1 tablet by mouth daily., Disp: , Rfl:    Omega-3 Fatty Acids (FISH OIL) 1000 MG CAPS, Take 1,000 mg by mouth 2 (two) times daily., Disp: , Rfl:    polyethylene glycol (MIRALAX / GLYCOLAX) 17 g packet, Take 17 g by mouth daily as needed., Disp: , Rfl:    propranolol ER (INDERAL LA) 80 MG 24 hr capsule, Take 1 capsule (80 mg total) by mouth daily., Disp: 90 capsule, Rfl: 1   QUEtiapine  (SEROQUEL XR) 50 MG TB24 24 hr tablet, Take 2 tablets (100 mg total) by mouth daily for 7 days, THEN 1 tablet (50 mg total) daily for 7 days, THEN 1 tablet (50 mg total) every other day for 7 days., Disp: 24 tablet, Rfl: 0   No Known Allergies Past Medical History:  Diagnosis Date   ADHD (attention deficit hyperactivity disorder)    Anxiety    Autism    Autistic behavior    Cognitive impairment    Developmental delay    Diabetes mellitus, type 2 ([place]) 01/21/2021   Hypertension    Mental developmental delay    Mixed hyperlipidemia 01/20/2021    History reviewed. No pertinent surgical history.  Social History   Socioeconomic History   Marital status: Single    Spouse name: Not on file   Number of children: Not on file   Years of education: Not on file   Highest education level: Not on file  Occupational History   Not on file  Tobacco Use   Smoking status: Never   Smokeless tobacco: Never  Vaping Use   Vaping Use: Never used  Substance and Sexual Activity   Alcohol use: Never   Drug use: Never   Sexual activity: Never  Other Topics Concern   Not on file  Social History Narrative   Not on file   Social Determinants of Health   Financial Resource Strain: Not on file  Food Insecurity: Not on file  Transportation Needs: Not on file  Physical Activity: Not on file  Stress: Not on file  Social Connections: Not on file  Intimate Partner Violence: Not on file        Objective:    BP 117/75    Pulse 91    Temp 97.7 F (36.5 C) (Temporal)    Ht '5\' 4"'  (1.626 m)    Wt 208 lb 6.4 oz (94.5 kg)    BMI 35.77 kg/m   Wt Readings from Last 3 Encounters:  10/20/21 208 lb 6.4 oz (94.5 kg)  08/19/21 212 lb 9.6 oz (96.4 kg)  07/22/21 211 lb (95.7 kg)    Physical Exam Vitals reviewed.  Constitutional:      General: He is not in acute distress.    Appearance: Normal appearance. He is obese. He is not ill-appearing, toxic-appearing or diaphoretic.  HENT:     Head:  Normocephalic and atraumatic.  Eyes:     General: No scleral icterus.       Right eye: No discharge.        Left eye: No discharge.     Conjunctiva/sclera: Conjunctivae normal.  Cardiovascular:     Rate and Rhythm: Normal rate.  Pulmonary:     Effort: Pulmonary effort is normal. No respiratory distress.  Musculoskeletal:  General: Normal range of motion.     Cervical back: Normal range of motion.  Skin:    General: Skin is warm and dry.  Neurological:     Mental Status: He is alert and oriented to person, place, and time. Mental status is at baseline.  Psychiatric:        Mood and Affect: Mood normal.        Behavior: Behavior normal.        Thought Content: Thought content normal.        Judgment: Judgment normal.    Lab Results  Component Value Date   TSH 2.160 11/19/2020   Lab Results  Component Value Date   WBC 7.2 06/29/2021   HGB 14.8 06/29/2021   HCT 43.4 06/29/2021   MCV 92 06/29/2021   PLT 281 06/29/2021   Lab Results  Component Value Date   NA 141 06/29/2021   K 4.7 06/29/2021   CO2 24 06/29/2021   GLUCOSE 129 (H) 06/29/2021   BUN 14 06/29/2021   CREATININE 1.01 06/29/2021   BILITOT 0.4 06/29/2021   ALKPHOS 61 06/29/2021   AST 28 06/29/2021   ALT 47 (H) 06/29/2021   PROT 7.5 06/29/2021   ALBUMIN 4.8 06/29/2021   CALCIUM 10.2 06/29/2021   ANIONGAP 11 05/21/2020   EGFR 107 06/29/2021   Lab Results  Component Value Date   CHOL 193 06/29/2021   Lab Results  Component Value Date   HDL 28 (L) 06/29/2021   Lab Results  Component Value Date   LDLCALC 119 (H) 06/29/2021   Lab Results  Component Value Date   TRIG 260 (H) 06/29/2021   Lab Results  Component Value Date   CHOLHDL 6.9 (H) 06/29/2021   Lab Results  Component Value Date   HGBA1C 6.1 (H) 06/29/2021

## 2021-12-18 ENCOUNTER — Ambulatory Visit (INDEPENDENT_AMBULATORY_CARE_PROVIDER_SITE_OTHER): Payer: Medicaid Other | Admitting: Family Medicine

## 2021-12-18 ENCOUNTER — Encounter: Payer: Self-pay | Admitting: Family Medicine

## 2021-12-18 VITALS — BP 132/89 | HR 124 | Temp 98.1°F | Ht 64.0 in | Wt 207.2 lb

## 2021-12-18 DIAGNOSIS — F840 Autistic disorder: Secondary | ICD-10-CM

## 2021-12-18 DIAGNOSIS — F411 Generalized anxiety disorder: Secondary | ICD-10-CM | POA: Diagnosis not present

## 2021-12-18 DIAGNOSIS — R454 Irritability and anger: Secondary | ICD-10-CM | POA: Diagnosis not present

## 2021-12-18 DIAGNOSIS — A084 Viral intestinal infection, unspecified: Secondary | ICD-10-CM

## 2021-12-18 DIAGNOSIS — F332 Major depressive disorder, recurrent severe without psychotic features: Secondary | ICD-10-CM

## 2021-12-18 DIAGNOSIS — E669 Obesity, unspecified: Secondary | ICD-10-CM

## 2021-12-18 DIAGNOSIS — E782 Mixed hyperlipidemia: Secondary | ICD-10-CM

## 2021-12-18 DIAGNOSIS — R7303 Prediabetes: Secondary | ICD-10-CM

## 2021-12-18 MED ORDER — FLUOXETINE HCL 40 MG PO CAPS: 40.0000 mg | ORAL_CAPSULE | Freq: Every day | ORAL | 1 refills | Status: DC

## 2021-12-18 MED ORDER — ONDANSETRON 4 MG PO TBDP: 4.0000 mg | ORAL_TABLET | Freq: Three times a day (TID) | ORAL | 0 refills | Status: DC | PRN

## 2021-12-18 NOTE — Patient Instructions (Addendum)
[facility] ?[address] - [place] [address] ?[phone] ? ?Please return for your lab work next week when you are fasting. ?

## 2021-12-18 NOTE — Progress Notes (Signed)
Assessment & Plan:  1-4. Difficulty controlling anger/Autism spectrum disorder/Generalized anxiety disorder/Severe episode of recurrent major depressive disorder, without psychotic features ([place]) Uncontrolled. Upon review of referral it appears [facility] in [place] did try to contact the patient. I have provided him the name, address, and phone # so that he can call and get an appointment scheduled.  - FLUoxetine (PROZAC) 40 MG capsule; Take 1 capsule (40 mg total) by mouth daily.  Dispense: 90 capsule; Refill: 1 - CMP14+EGFR; Future  5. Obesity (BMI 35.0-39.9 without comorbidity) Encouraged healthy eating and exercise. Patient has lost an additional lb since our last visit 2 months ago. - CBC with Differential/Platelet; Future - CMP14+EGFR; Future - Lipid panel; Future - TSH; Future  6. Prediabetes - Bayer DCA Hb A1c Waived; Future  7. Hypercholesterolemia with hypertriglyceridemia - Lipid panel; Future  8. Viral gastroenteritis Education provided on viral gastroenteritis. Encouraged adequate hydration. Reassurance provided it sounds like he is on the downward slope of this. - ondansetron (ZOFRAN-ODT) 4 MG disintegrating tablet; Take 1 tablet (4 mg total) by mouth every 8 (eight) hours as needed for nausea or vomiting.  Dispense: 20 tablet; Refill: 0   Return as directed after labs result.  Hendricks Limes, MSN, APRN, FNP-C [facility]  Subjective:    Patient ID: Peter Bradshaw, male    DOB: 1995/12/03, 26 y.o.   MRN: [id number]  Patient Care Team: Loman Brooklyn, FNP as PCP - General (Family Medicine)   Chief Complaint:  Chief Complaint  Patient presents with   Medical Management of Chronic Issues   Emesis    Patient states that in the last week he has been throwing up meat after eating.     Diarrhea    X1 week    HPI: Peter Bradshaw is a 26 y.o. male presenting on 12/18/2021 for Medical Management of Chronic Issues, Emesis  (Patient states that in the last week he has been throwing up meat after eating.  ), and Diarrhea (X1 week)  Patient is NOT accompanied by his guardian today; he reports he is trying to be more independent.  Patient's Prozac was increased 2 months ago, which he does not feel has been helpful. He is also still having a lot of anger issues. He has previously failed treatment with Lexapro and Seroquel. He was referred to psychiatry in August and November; unfortunately both referrals were closed out by the referring office. He was referred again in January and he states he hasn't heard from them either.   Depression screen [facility] 2/9 12/18/2021 10/20/2021 08/19/2021  Decreased Interest '2 2 3  ' Down, Depressed, Hopeless 2 0 2  PHQ - 2 Score '4 2 5  ' Altered sleeping 2 0 3  Tired, decreased energy '2 2 3  ' Change in appetite 3 0 2  Feeling bad or failure about yourself  '3 1 2  ' Trouble concentrating '2 1 2  ' Moving slowly or fidgety/restless 3 0 0  Suicidal thoughts 0 0 0  PHQ-9 Score '19 6 17  ' Difficult doing work/chores Not difficult at all Not difficult at all Somewhat difficult  Some recent data might be hidden   GAD 7 : Generalized Anxiety Score 12/18/2021 10/20/2021 08/19/2021 07/22/2021  Nervous, Anxious, on Edge '2 1 3 3  ' Control/stop worrying 3 1 0 2  Worry too much - different things 0 1 0 2  Trouble relaxing '2 1 3 2  ' Restless '3 2 2 3  ' Easily annoyed or  irritable '1 2 3 3  ' Afraid - awful might happen 2 2 0 1  Total GAD 7 Score '13 10 11 16  ' Anxiety Difficulty Somewhat difficult Not difficult at all Somewhat difficult -    New complaints: Patient reports he has had vomiting and diarrhea x1 week. He threw up yesterday x3. He has not thrown up any today. He did eat a [employer] sandwich today. He last had diarrhea yesterday x1. No diarrhea today. He is drinking a lot of water. States his guardian and some other people that are close to him have had similar symptoms.   Social history:  Relevant past  medical, surgical, family and social history reviewed and updated as indicated. Interim medical history since our last visit reviewed.  Allergies and medications reviewed and updated.  DATA REVIEWED: CHART IN EPIC  ROS: Negative unless specifically indicated above in HPI.    Current Outpatient Medications:    FLUoxetine (PROZAC) 40 MG capsule, Take 1 capsule (40 mg total) by mouth daily., Disp: 30 capsule, Rfl: 2   Multiple Vitamins-Minerals (MULTIVITAMIN WITH MINERALS) tablet, Take 1 tablet by mouth daily., Disp: , Rfl:    Omega-3 Fatty Acids (FISH OIL) 1000 MG CAPS, Take 1,000 mg by mouth 2 (two) times daily., Disp: , Rfl:    polyethylene glycol (MIRALAX / GLYCOLAX) 17 g packet, Take 17 g by mouth daily as needed., Disp: , Rfl:    propranolol ER (INDERAL LA) 80 MG 24 hr capsule, Take 1 capsule (80 mg total) by mouth daily., Disp: 90 capsule, Rfl: 1   No Known Allergies Past Medical History:  Diagnosis Date   ADHD (attention deficit hyperactivity disorder)    Anxiety    Autism    Autistic behavior    Cognitive impairment    Developmental delay    Diabetes mellitus, type 2 ([place]) 01/21/2021   Hypertension    Mental developmental delay    Mixed hyperlipidemia 01/20/2021    History reviewed. No pertinent surgical history.  Social History   Socioeconomic History   Marital status: Single    Spouse name: Not on file   Number of children: Not on file   Years of education: Not on file   Highest education level: Not on file  Occupational History   Not on file  Tobacco Use   Smoking status: Never   Smokeless tobacco: Never  Vaping Use   Vaping Use: Never used  Substance and Sexual Activity   Alcohol use: Never   Drug use: Never   Sexual activity: Never  Other Topics Concern   Not on file  Social History Narrative   Not on file   Social Determinants of Health   Financial Resource Strain: Not on file  Food Insecurity: Not on file  Transportation Needs: Not on file   Physical Activity: Not on file  Stress: Not on file  Social Connections: Not on file  Intimate Partner Violence: Not on file        Objective:    BP 132/89    Pulse (!) 124    Temp 98.1 F (36.7 C) (Temporal)    Ht '5\' 4"'  (1.626 m)    Wt 207 lb 3.2 oz (94 kg)    BMI 35.57 kg/m   Wt Readings from Last 3 Encounters:  12/18/21 207 lb 3.2 oz (94 kg)  10/20/21 208 lb 6.4 oz (94.5 kg)  08/19/21 212 lb 9.6 oz (96.4 kg)    Physical Exam Vitals reviewed.  Constitutional:  General: He is not in acute distress.    Appearance: Normal appearance. He is obese. He is not ill-appearing, toxic-appearing or diaphoretic.  HENT:     Head: Normocephalic and atraumatic.  Eyes:     General: No scleral icterus.       Right eye: No discharge.        Left eye: No discharge.     Conjunctiva/sclera: Conjunctivae normal.  Cardiovascular:     Rate and Rhythm: Normal rate.  Pulmonary:     Effort: Pulmonary effort is normal. No respiratory distress.  Musculoskeletal:        General: Normal range of motion.     Cervical back: Normal range of motion.  Skin:    General: Skin is warm and dry.  Neurological:     Mental Status: He is alert and oriented to person, place, and time. Mental status is at baseline.  Psychiatric:        Mood and Affect: Mood normal.        Behavior: Behavior normal.        Thought Content: Thought content normal.        Judgment: Judgment normal.    Lab Results  Component Value Date   TSH 2.160 11/19/2020   Lab Results  Component Value Date   WBC 7.2 06/29/2021   HGB 14.8 06/29/2021   HCT 43.4 06/29/2021   MCV 92 06/29/2021   PLT 281 06/29/2021   Lab Results  Component Value Date   NA 141 06/29/2021   K 4.7 06/29/2021   CO2 24 06/29/2021   GLUCOSE 129 (H) 06/29/2021   BUN 14 06/29/2021   CREATININE 1.01 06/29/2021   BILITOT 0.4 06/29/2021   ALKPHOS 61 06/29/2021   AST 28 06/29/2021   ALT 47 (H) 06/29/2021   PROT 7.5 06/29/2021   ALBUMIN 4.8  06/29/2021   CALCIUM 10.2 06/29/2021   ANIONGAP 11 05/21/2020   EGFR 107 06/29/2021   Lab Results  Component Value Date   CHOL 193 06/29/2021   Lab Results  Component Value Date   HDL 28 (L) 06/29/2021   Lab Results  Component Value Date   LDLCALC 119 (H) 06/29/2021   Lab Results  Component Value Date   TRIG 260 (H) 06/29/2021   Lab Results  Component Value Date   CHOLHDL 6.9 (H) 06/29/2021   Lab Results  Component Value Date   HGBA1C 6.1 (H) 06/29/2021

## 2021-12-22 ENCOUNTER — Encounter: Payer: Self-pay | Admitting: Family Medicine

## 2021-12-22 ENCOUNTER — Other Ambulatory Visit: Payer: Medicaid Other

## 2021-12-22 DIAGNOSIS — R454 Irritability and anger: Secondary | ICD-10-CM

## 2021-12-22 DIAGNOSIS — R7303 Prediabetes: Secondary | ICD-10-CM

## 2021-12-22 DIAGNOSIS — F411 Generalized anxiety disorder: Secondary | ICD-10-CM

## 2021-12-22 DIAGNOSIS — E782 Mixed hyperlipidemia: Secondary | ICD-10-CM

## 2021-12-22 DIAGNOSIS — E669 Obesity, unspecified: Secondary | ICD-10-CM

## 2021-12-22 DIAGNOSIS — F332 Major depressive disorder, recurrent severe without psychotic features: Secondary | ICD-10-CM

## 2021-12-22 LAB — BAYER DCA HB A1C WAIVED: HB A1C (BAYER DCA - WAIVED): 5.9 % — ABNORMAL HIGH (ref 4.8–5.6)

## 2021-12-23 LAB — LIPID PANEL
Chol/HDL Ratio: 8.2 ratio — ABNORMAL HIGH (ref 0.0–5.0)
Cholesterol, Total: 229 mg/dL — ABNORMAL HIGH (ref 100–199)
HDL: 28 mg/dL — ABNORMAL LOW (ref >39)
LDL Chol Calc (NIH): 122 mg/dL — ABNORMAL HIGH (ref 0–99)
Triglycerides: 446 mg/dL — ABNORMAL HIGH (ref 0–149)
VLDL Cholesterol Cal: 79 mg/dL — ABNORMAL HIGH (ref 5–40)

## 2021-12-23 LAB — CBC WITH DIFFERENTIAL/PLATELET
Basophils Absolute: 0 10*3/uL (ref 0.0–0.2)
Basos: 0 %
EOS (ABSOLUTE): 0.2 10*3/uL (ref 0.0–0.4)
Eos: 2 %
Hematocrit: 42.1 % (ref 37.5–51.0)
Hemoglobin: 14.8 g/dL (ref 13.0–17.7)
Immature Grans (Abs): 0 10*3/uL (ref 0.0–0.1)
Immature Granulocytes: 0 %
Lymphocytes Absolute: 1.7 10*3/uL (ref 0.7–3.1)
Lymphs: 25 %
MCH: 31.7 pg (ref 26.6–33.0)
MCHC: 35.2 g/dL (ref 31.5–35.7)
MCV: 90 fL (ref 79–97)
Monocytes Absolute: 0.5 10*3/uL (ref 0.1–0.9)
Monocytes: 8 %
Neutrophils Absolute: 4.3 10*3/uL (ref 1.4–7.0)
Neutrophils: 65 %
Platelets: 269 10*3/uL (ref 150–450)
RBC: 4.67 x10E6/uL (ref 4.14–5.80)
RDW: 12.8 % (ref 11.6–15.4)
WBC: 6.7 10*3/uL (ref 3.4–10.8)

## 2021-12-23 LAB — CMP14+EGFR
ALT: 37 IU/L (ref 0–44)
AST: 28 IU/L (ref 0–40)
Albumin/Globulin Ratio: 2 (ref 1.2–2.2)
Albumin: 4.9 g/dL (ref 4.1–5.2)
Alkaline Phosphatase: 62 IU/L (ref 44–121)
BUN/Creatinine Ratio: 16 (ref 9–20)
BUN: 15 mg/dL (ref 6–20)
Bilirubin Total: 0.5 mg/dL (ref 0.0–1.2)
CO2: 20 mmol/L (ref 20–29)
Calcium: 10.3 mg/dL — ABNORMAL HIGH (ref 8.7–10.2)
Chloride: 98 mmol/L (ref 96–106)
Creatinine, Ser: 0.94 mg/dL (ref 0.76–1.27)
Globulin, Total: 2.5 g/dL (ref 1.5–4.5)
Glucose: 113 mg/dL — ABNORMAL HIGH (ref 70–99)
Potassium: 4.4 mmol/L (ref 3.5–5.2)
Sodium: 137 mmol/L (ref 134–144)
Total Protein: 7.4 g/dL (ref 6.0–8.5)
eGFR: 116 mL/min/{1.73_m2} (ref >59)

## 2021-12-23 LAB — TSH: TSH: 1.62 u[IU]/mL (ref 0.450–4.500)

## 2022-03-23 ENCOUNTER — Ambulatory Visit: Payer: Medicaid Other | Admitting: Family Medicine

## 2022-03-23 ENCOUNTER — Encounter: Payer: Self-pay | Admitting: Family Medicine

## 2022-03-23 VITALS — BP 132/89 | HR 115 | Temp 97.8°F | Ht 64.0 in | Wt 205.2 lb

## 2022-03-23 DIAGNOSIS — F411 Generalized anxiety disorder: Secondary | ICD-10-CM | POA: Diagnosis not present

## 2022-03-23 DIAGNOSIS — F332 Major depressive disorder, recurrent severe without psychotic features: Secondary | ICD-10-CM

## 2022-03-23 DIAGNOSIS — F840 Autistic disorder: Secondary | ICD-10-CM | POA: Diagnosis not present

## 2022-03-23 DIAGNOSIS — R454 Irritability and anger: Secondary | ICD-10-CM | POA: Diagnosis not present

## 2022-03-23 DIAGNOSIS — E782 Mixed hyperlipidemia: Secondary | ICD-10-CM

## 2022-03-23 DIAGNOSIS — E669 Obesity, unspecified: Secondary | ICD-10-CM

## 2022-03-23 DIAGNOSIS — R000 Tachycardia, unspecified: Secondary | ICD-10-CM

## 2022-03-23 DIAGNOSIS — R7303 Prediabetes: Secondary | ICD-10-CM

## 2022-03-23 DIAGNOSIS — F69 Unspecified disorder of adult personality and behavior: Secondary | ICD-10-CM

## 2022-03-23 MED ORDER — PROPRANOLOL HCL ER 80 MG PO CP24: 80.0000 mg | ORAL_CAPSULE | Freq: Every day | ORAL | 1 refills | Status: DC
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
## 2022-03-23 NOTE — Patient Instructions (Signed)
[facility] [address] [place], [address] [phone] [id number] [phone]

## 2022-03-26 ENCOUNTER — Telehealth: Payer: Self-pay | Admitting: Family Medicine

## 2022-03-26 NOTE — Telephone Encounter (Signed)
Peter Bradshaw gave him the number to where the referral was sent to. Out of network Please send to in network and notify care giver

## 2022-03-26 NOTE — Telephone Encounter (Signed)
Legal guardian Reita Cliche) said the number to the psychiatrist that he was given is out of network and wants to know if we have anywhere else that he could call. Please call back and advise.

## 2022-04-02 NOTE — Telephone Encounter (Signed)
Please make patient's guardian aware and provide updated phone #.

## 2022-04-02 NOTE — Telephone Encounter (Signed)
Aware and verbalizes understanding.  

## 2022-06-02 ENCOUNTER — Encounter: Payer: Self-pay | Admitting: *Deleted

## 2022-09-22 ENCOUNTER — Encounter: Payer: Medicaid Other | Admitting: Family Medicine

## 2022-09-27 ENCOUNTER — Ambulatory Visit (INDEPENDENT_AMBULATORY_CARE_PROVIDER_SITE_OTHER): Payer: Medicaid Other | Admitting: Family Medicine

## 2022-09-27 ENCOUNTER — Encounter: Payer: Self-pay | Admitting: Family Medicine

## 2022-09-27 VITALS — BP 119/78 | HR 110 | Temp 96.9°F | Ht <= 58 in | Wt 216.2 lb

## 2022-09-27 DIAGNOSIS — E782 Mixed hyperlipidemia: Secondary | ICD-10-CM

## 2022-09-27 DIAGNOSIS — E1165 Type 2 diabetes mellitus with hyperglycemia: Secondary | ICD-10-CM | POA: Diagnosis not present

## 2022-09-27 DIAGNOSIS — Z0001 Encounter for general adult medical examination with abnormal findings: Secondary | ICD-10-CM

## 2022-09-27 DIAGNOSIS — Z0000 Encounter for general adult medical examination without abnormal findings: Secondary | ICD-10-CM

## 2022-09-27 DIAGNOSIS — E119 Type 2 diabetes mellitus without complications: Secondary | ICD-10-CM

## 2022-09-27 MED ORDER — METFORMIN HCL ER 500 MG PO TB24: 500.0000 mg | ORAL_TABLET | Freq: Every day | ORAL | 2 refills | Status: DC

## 2022-09-27 NOTE — Patient Instructions (Signed)
Make appt. With [place] for first available opening

## 2022-09-27 NOTE — Progress Notes (Signed)
Subjective:  Patient ID: Peter Bradshaw, male    DOB: 07/11/1996  Age: 26 y.o. MRN: [id number]  CC: Annual Exam   HPI Peter Bradshaw presents for CPE. Off all meds. Stopped taking olanzapine due to drowsy. Had been changed twice. Doesn't want to go back to psych since he didn't take the medicine. Saw Peter Seller, DNP, [place] at [place], [place]. Pt. Dx with autism.      09/27/2022    4:31 PM 03/23/2022    3:35 PM 12/18/2021    2:16 PM  Depression screen PHQ 2/9  Decreased Interest _0 Down, Depressed, Hopeless _1 PHQ - 2 Score _2 Altered sleeping 2 0 2  Tired, decreased energy _3 Change in appetite 2 0 3  Feeling bad or failure about yourself  _4 Trouble concentrating _5 Moving slowly or fidgety/restless _6 Suicidal thoughts 0 0 0  PHQ-9 Score _7 Difficult doing work/chores Somewhat difficult Somewhat difficult Not difficult at all    History Peter Bradshaw has a past medical history of ADHD (attention deficit hyperactivity disorder), Anxiety, Autism, Autistic behavior, Cognitive impairment, Developmental delay, Hypertension, Mental developmental delay, Mixed hyperlipidemia (01/20/2021), and Prediabetes (01/21/2021).   He has no past surgical history on file.   His family history includes Depression in his maternal grandmother and mother; Heart Problems in his father; Obesity in his father.He reports that he has never smoked. He has never used smokeless tobacco. He reports that he does not drink alcohol and does not use drugs.    ROS Review of Systems  Constitutional:  Negative for activity change, fatigue, fever and unexpected weight change.  HENT:  Negative for congestion, ear pain, hearing loss, postnasal drip and trouble swallowing.   Eyes:  Negative for pain and visual disturbance.  Respiratory:  Negative for cough, chest tightness and shortness of breath.   Cardiovascular:  Negative for chest pain, palpitations and  leg swelling.  Gastrointestinal:  Negative for abdominal distention, abdominal pain, blood in stool, constipation, diarrhea, nausea and vomiting.  Endocrine: Negative for cold intolerance, heat intolerance and polydipsia.  Genitourinary:  Negative for difficulty urinating, dysuria, flank pain, frequency and urgency.  Musculoskeletal:  Negative for arthralgias and joint swelling.  Skin:  Negative for color change, rash and wound.  Neurological:  Negative for dizziness, syncope, speech difficulty, weakness, light-headedness, numbness and headaches.  Hematological:  Does not bruise/bleed easily.  Psychiatric/Behavioral:  Negative for confusion, decreased concentration, dysphoric mood and sleep disturbance. The patient is not nervous/anxious.     Objective:  BP 119/78   Pulse (!) 110   Temp (!) 96.9 F (36.1 C)   Ht 1' (0.305 m)   Wt 216 lb 3.2 oz (98.1 kg)   SpO2 94%   BMI 1055.59 kg/m   BP Readings from Last 3 Encounters:  09/27/22 119/78  03/23/22 132/89  12/18/21 132/89    Wt Readings from Last 3 Encounters:  09/27/22 216 lb 3.2 oz (98.1 kg)  03/23/22 205 lb 3.2 oz (93.1 kg)  12/18/21 207 lb 3.2 oz (94 kg)     Physical Exam Vitals reviewed.  Constitutional:      Appearance: He is well-developed.  HENT:     Head: Normocephalic and atraumatic.     Right Ear: External ear normal.     Left Ear: External ear normal.  Mouth/Throat:     Pharynx: No oropharyngeal exudate or posterior oropharyngeal erythema.  Eyes:     Pupils: Pupils are equal, round, and reactive to light.  Neck:     Thyroid: No thyromegaly.     Trachea: No tracheal deviation.  Cardiovascular:     Rate and Rhythm: Normal rate and regular rhythm.     Heart sounds: Normal heart sounds. No murmur heard.    No friction rub. No gallop.  Pulmonary:     Effort: No respiratory distress.     Breath sounds: Normal breath sounds. No wheezing or rales.  Abdominal:     General: Bowel sounds are normal. There  is no distension.     Palpations: Abdomen is soft. There is no mass.     Tenderness: There is no abdominal tenderness.     Hernia: There is no hernia in the left inguinal area.  Genitourinary:    Penis: Normal.      Testes: Normal.  Musculoskeletal:        General: Normal range of motion.     Cervical back: Normal range of motion and neck supple.  Lymphadenopathy:     Cervical: No cervical adenopathy.  Skin:    General: Skin is warm and dry.  Neurological:     Mental Status: He is alert and oriented to person, place, and time.       Assessment & Plan:   Peter Bradshaw was seen today for annual exam.  Diagnoses and all orders for this visit:  Well adult exam -     Microalbumin / creatinine urine ratio -     Bayer DCA Hb A1c Waived -     CBC with Differential/Platelet -     CMP14+EGFR -     Lipid panel -     Urinalysis -     VITAMIN D 25 Hydroxy (Vit-D Deficiency, Fractures)  Hypercholesterolemia with hypertriglyceridemia -     Lipid panel  Diabetes mellitus, new onset ([place])  Type 2 diabetes mellitus with hyperglycemia, without long-term current use of insulin (HCC) -     Microalbumin / creatinine urine ratio -     Bayer DCA Hb A1c Waived  Other orders -     metFORMIN (GLUCOPHAGE-XR) 500 MG 24 hr tablet; Take 1 tablet (500 mg total) by mouth daily with breakfast.       I have discontinued Peter Bradshaw. Peter Bradshaw's Fish Oil, FLUoxetine, and Vraylar. I am also having him start on metFORMIN. Additionally, I am having him maintain his multivitamin with minerals, polyethylene glycol, propranolol ER, and OLANZapine.  Allergies as of 09/27/2022   No Known Allergies      Medication List        Accurate as of September 27, 2022 11:08 PM. If you have any questions, ask your nurse or doctor.          STOP taking these medications    Fish Oil 1000 MG Caps Stopped by: Claretta Fraise, MD   FLUoxetine 40 MG capsule Commonly known as: PROZAC Stopped by: Claretta Fraise, MD    Vraylar 3 MG capsule Generic drug: cariprazine Stopped by: Claretta Fraise, MD       TAKE these medications    metFORMIN 500 MG 24 hr tablet Commonly known as: GLUCOPHAGE-XR Take 1 tablet (500 mg total) by mouth daily with breakfast. Started by: Claretta Fraise, MD   multivitamin with minerals tablet Take 1 tablet by mouth daily.   OLANZapine 5 MG tablet Commonly known as: ZYPREXA Take  5 mg by mouth at bedtime.   polyethylene glycol 17 g packet Commonly known as: MIRALAX / GLYCOLAX Take 17 g by mouth daily as needed.   propranolol ER 80 MG 24 hr capsule Commonly known as: INDERAL LA Take 1 capsule (80 mg total) by mouth daily.         Follow-up: Return in about 1 month (around 10/28/2022).  Claretta Fraise, M.D.

## 2022-09-28 LAB — CMP14+EGFR
ALT: 47 IU/L — ABNORMAL HIGH (ref 0–44)
AST: 34 IU/L (ref 0–40)
Albumin/Globulin Ratio: 1.9 (ref 1.2–2.2)
Albumin: 4.7 g/dL (ref 4.3–5.2)
Alkaline Phosphatase: 73 IU/L (ref 44–121)
BUN/Creatinine Ratio: 19 (ref 9–20)
BUN: 16 mg/dL (ref 6–20)
Bilirubin Total: 0.3 mg/dL (ref 0.0–1.2)
CO2: 21 mmol/L (ref 20–29)
Calcium: 9.7 mg/dL (ref 8.7–10.2)
Chloride: 97 mmol/L (ref 96–106)
Creatinine, Ser: 0.84 mg/dL (ref 0.76–1.27)
Globulin, Total: 2.5 g/dL (ref 1.5–4.5)
Glucose: 258 mg/dL — ABNORMAL HIGH (ref 70–99)
Potassium: 4.1 mmol/L (ref 3.5–5.2)
Sodium: 137 mmol/L (ref 134–144)
Total Protein: 7.2 g/dL (ref 6.0–8.5)
eGFR: 124 mL/min/{1.73_m2} (ref >59)

## 2022-09-28 LAB — CBC WITH DIFFERENTIAL/PLATELET
Basophils Absolute: 0 10*3/uL (ref 0.0–0.2)
Basos: 0 %
EOS (ABSOLUTE): 0.2 10*3/uL (ref 0.0–0.4)
Eos: 2 %
Hematocrit: 44 % (ref 37.5–51.0)
Hemoglobin: 15.5 g/dL (ref 13.0–17.7)
Immature Grans (Abs): 0.1 10*3/uL (ref 0.0–0.1)
Immature Granulocytes: 1 %
Lymphocytes Absolute: 1.7 10*3/uL (ref 0.7–3.1)
Lymphs: 23 %
MCH: 31.6 pg (ref 26.6–33.0)
MCHC: 35.2 g/dL (ref 31.5–35.7)
MCV: 90 fL (ref 79–97)
Monocytes Absolute: 0.6 10*3/uL (ref 0.1–0.9)
Monocytes: 8 %
Neutrophils Absolute: 5 10*3/uL (ref 1.4–7.0)
Neutrophils: 66 %
Platelets: 268 10*3/uL (ref 150–450)
RBC: 4.9 x10E6/uL (ref 4.14–5.80)
RDW: 13.6 % (ref 11.6–15.4)
WBC: 7.6 10*3/uL (ref 3.4–10.8)

## 2022-09-28 LAB — VITAMIN D 25 HYDROXY (VIT D DEFICIENCY, FRACTURES): Vit D, 25-Hydroxy: 17.5 ng/mL — ABNORMAL LOW (ref 30.0–100.0)

## 2022-09-28 LAB — LIPID PANEL
Chol/HDL Ratio: 11.4 ratio — ABNORMAL HIGH (ref 0.0–5.0)
Cholesterol, Total: 217 mg/dL — ABNORMAL HIGH (ref 100–199)
HDL: 19 mg/dL — ABNORMAL LOW (ref >39)
Triglycerides: 878 mg/dL (ref 0–149)

## 2022-09-28 LAB — BAYER DCA HB A1C WAIVED: HB A1C (BAYER DCA - WAIVED): 9.9 % — ABNORMAL HIGH (ref 4.8–5.6)

## 2022-09-29 ENCOUNTER — Telehealth: Payer: Self-pay | Admitting: Family Medicine

## 2022-09-29 NOTE — Telephone Encounter (Signed)
Guardian aware to give one per day

## 2022-09-29 NOTE — Telephone Encounter (Signed)
One daily for now. Will likely need twice daily eventually, but needs to let his body get used to one a day for a while first.

## 2022-09-30 ENCOUNTER — Other Ambulatory Visit: Payer: Self-pay | Admitting: *Deleted

## 2022-09-30 MED ORDER — VITAMIN D (ERGOCALCIFEROL) 1.25 MG (50000 UNIT) PO CAPS: 50000.0000 [IU] | ORAL_CAPSULE | ORAL | 3 refills | Status: DC

## 2022-11-08 ENCOUNTER — Encounter: Payer: Self-pay | Admitting: Family Medicine

## 2022-11-08 ENCOUNTER — Ambulatory Visit (INDEPENDENT_AMBULATORY_CARE_PROVIDER_SITE_OTHER): Payer: Medicaid Other | Admitting: Family Medicine

## 2022-11-08 VITALS — BP 129/87 | HR 92 | Temp 97.2°F | Ht 64.5 in | Wt 213.0 lb

## 2022-11-08 DIAGNOSIS — F840 Autistic disorder: Secondary | ICD-10-CM | POA: Diagnosis not present

## 2022-11-08 DIAGNOSIS — G43711 Chronic migraine without aura, intractable, with status migrainosus: Secondary | ICD-10-CM | POA: Diagnosis not present

## 2022-11-08 DIAGNOSIS — F411 Generalized anxiety disorder: Secondary | ICD-10-CM

## 2022-11-08 DIAGNOSIS — E119 Type 2 diabetes mellitus without complications: Secondary | ICD-10-CM

## 2022-11-08 MED ORDER — TOPIRAMATE 25 MG PO TABS: ORAL_TABLET | ORAL | 0 refills | Status: DC

## 2022-11-08 MED ORDER — BLOOD GLUCOSE MONITOR KIT: PACK | 99 refills | Status: AC

## 2022-11-08 NOTE — Progress Notes (Signed)
Subjective:  Patient ID: Peter Bradshaw, male    DOB: [birth year]  Age: 27 y.o. MRN: [id number]  CC: Follow-up   HPI Peter Bradshaw presents for recheck of DM - working on diet. Eating more vegetables, less bread. Bread is whole grain. Taking meds. Not familiar with monitoring glucose. Father with him. States he can show I'm how.   Frequent HA since middle school. Throbbing midline to left of forehead. Has to go to bed. No effect on vision. Last one day. Severe pain. Unable to state 1-10 scale.   Pt. With autism spectrum dx. Tells me hims psychiatrist dismissed him due to no shows.     11/08/2022    4:01 PM 09/27/2022    4:31 PM 03/23/2022    3:35 PM  Depression screen PHQ 2/9  Decreased Interest 2 2 2   Down, Depressed, Hopeless 1 2 2   PHQ - 2 Score 3 4 4   Altered sleeping 0 2 0  Tired, decreased energy 2 2 3   Change in appetite 1 2 0  Feeling bad or failure about yourself  0 2 1  Trouble concentrating 2 3 1   Moving slowly or fidgety/restless 0 1 1  Suicidal thoughts 0 0 0  PHQ-9 Score 8 16 10   Difficult doing work/chores Not difficult at all Somewhat difficult Somewhat difficult    History Peter Bradshaw has a past medical history of ADHD (attention deficit hyperactivity disorder), Anxiety, Autism, Autistic behavior, Cognitive impairment, Developmental delay, Hypertension, Mental developmental delay, Mixed hyperlipidemia (01/20/2021), and Prediabetes (01/21/2021).   He has no past surgical history on file.   His family history includes Depression in his maternal grandmother and mother; Heart Problems in his father; Obesity in his father.He reports that he has never smoked. He has never used smokeless tobacco. He reports that he does not drink alcohol and does not use drugs.    ROS Review of Systems  Constitutional:  Negative for fever.  Respiratory:  Negative for shortness of breath.   Cardiovascular:  Negative for chest pain.  Musculoskeletal:  Negative for arthralgias.  Skin:   Negative for rash.    Objective:  BP 129/87   Pulse 92   Temp (!) 97.2 F (36.2 C)   Ht 5' 4.5" (1.638 m)   Wt 213 lb (96.6 kg)   SpO2 95%   BMI 36.00 kg/m   BP Readings from Last 3 Encounters:  11/08/22 129/87  09/27/22 119/78  03/23/22 132/89    Wt Readings from Last 3 Encounters:  11/08/22 213 lb (96.6 kg)  09/27/22 216 lb 3.2 oz (98.1 kg)  03/23/22 205 lb 3.2 oz (93.1 kg)     Physical Exam Vitals reviewed.  Constitutional:      Appearance: He is well-developed.  HENT:     Head: Normocephalic and atraumatic.     Right Ear: External ear normal.     Left Ear: External ear normal.     Mouth/Throat:     Pharynx: No oropharyngeal exudate or posterior oropharyngeal erythema.  Eyes:     Pupils: Pupils are equal, round, and reactive to light.  Cardiovascular:     Rate and Rhythm: Normal rate and regular rhythm.     Heart sounds: No murmur heard. Pulmonary:     Effort: No respiratory distress.     Breath sounds: Normal breath sounds.  Musculoskeletal:     Cervical back: Normal range of motion and neck supple.  Neurological:     Mental Status: He is alert and oriented  to person, place, and time.       Assessment & Plan:   Peter Bradshaw was seen today for follow-up.  Diagnoses and all orders for this visit:  Generalized anxiety disorder  Autism spectrum disorder  Diabetes mellitus, new onset ([place])  Intractable chronic migraine without aura and with status migrainosus  Other orders -     blood glucose meter kit and supplies KIT; Dispense based on patient and insurance preference. Use up to four times daily as directed. (FOR ICD-10 : E11.9 -     topiramate (TOPAMAX) 25 MG tablet; 1 nightly times 1 week.  Then 2 nightly for 1 week.  Then 3 nightly for 1 week.  Then 4 nightly.       I have discontinued Peter Bradshaw. Peter Bradshaw's OLANZapine. I am also having him start on blood glucose meter kit and supplies and topiramate. Additionally, I am having him maintain his  multivitamin with minerals, polyethylene glycol, propranolol ER, metFORMIN, and Vitamin D (Ergocalciferol).  Allergies as of 11/08/2022   No Known Allergies      Medication List        Accurate as of November 08, 2022  9:34 PM. If you have any questions, ask your nurse or doctor.          STOP taking these medications    OLANZapine 5 MG tablet Commonly known as: ZYPREXA Stopped by: Claretta Fraise, MD       TAKE these medications    blood glucose meter kit and supplies Kit Dispense based on patient and insurance preference. Use up to four times daily as directed. (FOR ICD-10 : E11.9 Started by: Claretta Fraise, MD   metFORMIN 500 MG 24 hr tablet Commonly known as: GLUCOPHAGE-XR Take 1 tablet (500 mg total) by mouth daily with breakfast.   multivitamin with minerals tablet Take 1 tablet by mouth daily.   polyethylene glycol 17 g packet Commonly known as: MIRALAX / GLYCOLAX Take 17 g by mouth daily as needed.   propranolol ER 80 MG 24 hr capsule Commonly known as: INDERAL LA Take 1 capsule (80 mg total) by mouth daily.   topiramate 25 MG tablet Commonly known as: TOPAMAX 1 nightly times 1 week.  Then 2 nightly for 1 week.  Then 3 nightly for 1 week.  Then 4 nightly. Started by: Claretta Fraise, MD   Vitamin D (Ergocalciferol) 1.25 MG (50000 UNIT) Caps capsule Commonly known as: DRISDOL Take 1 capsule (50,000 Units total) by mouth every 7 (seven) days.         Follow-up: Return in about 1 month (around 12/09/2022).  Claretta Fraise, M.D.

## 2022-12-13 ENCOUNTER — Ambulatory Visit (INDEPENDENT_AMBULATORY_CARE_PROVIDER_SITE_OTHER): Payer: Medicaid Other | Admitting: Family Medicine

## 2022-12-13 ENCOUNTER — Encounter: Payer: Self-pay | Admitting: Family Medicine

## 2022-12-13 VITALS — BP 138/82 | HR 90 | Temp 97.3°F | Ht 64.5 in | Wt 208.6 lb

## 2022-12-13 DIAGNOSIS — E119 Type 2 diabetes mellitus without complications: Secondary | ICD-10-CM | POA: Diagnosis not present

## 2022-12-13 MED ORDER — TOPIRAMATE 25 MG PO TABS: ORAL_TABLET | ORAL | 0 refills | Status: DC

## 2022-12-13 MED ORDER — METFORMIN HCL ER 500 MG PO TB24: 500.0000 mg | ORAL_TABLET | Freq: Two times a day (BID) | ORAL | 2 refills | Status: DC

## 2022-12-13 NOTE — Progress Notes (Signed)
Subjective:  Patient ID: Peter Bradshaw, male    DOB: 1996/06/18  Age: 27 y.o. MRN: [id number]  CC: Medical Management of Chronic Issues   HPI Peter Bradshaw presents forFollow-up of diabetes. Patient checks urine sugar at home.  USUALLY "SMALL" Patient denies symptoms such as polyuria, polydipsia, excessive hunger, nausea No significant hypoglycemic spells noted. Medications reviewed. Pt reports taking them regularly without complication/adverse reaction being reported today.   Topiramate - Was too sleepy. Violent HA 4 days ago.      12/13/2022    3:19 PM 11/08/2022    4:01 PM 09/27/2022    4:31 PM 03/23/2022    3:35 PM 12/18/2021    2:16 PM  Depression screen PHQ 2/9  Decreased Interest '2 2 2 2 2  '$ Down, Depressed, Hopeless '2 1 2 2 2  '$ PHQ - 2 Score '4 3 4 4 4  '$ Altered sleeping 1 0 2 0 2  Tired, decreased energy '2 2 2 3 2  '$ Change in appetite 0 1 2 0 3  Feeling bad or failure about yourself  0 0 '2 1 3  '$ Trouble concentrating '2 2 3 1 2  '$ Moving slowly or fidgety/restless 1 0 '1 1 3  '$ Suicidal thoughts 0 0 0 0 0  PHQ-9 Score '10 8 16 10 19  '$ Difficult doing work/chores Not difficult at all Not difficult at all Somewhat difficult Somewhat difficult Not difficult at all       12/13/2022    3:19 PM 11/08/2022    4:01 PM 09/27/2022    4:31 PM 03/23/2022    3:37 PM  GAD 7 : Generalized Anxiety Score  Nervous, Anxious, on Edge '2 2 2 1  '$ Control/stop worrying '2 2 2 1  '$ Worry too much - different things '2 2 2 1  '$ Trouble relaxing '2 1 3 1  '$ Restless '2 1 2 1  '$ Easily annoyed or irritable '2 2 3 1  '$ Afraid - awful might happen 1 0 2 2  Total GAD 7 Score '13 10 16 8  '$ Anxiety Difficulty Not difficult at all Somewhat difficult Somewhat difficult Somewhat difficult    Coping with anxiety and depression using AI. It'a like talking to another person.  History Peter Bradshaw has a past medical history of ADHD (attention deficit hyperactivity disorder), Anxiety, Autism, Autistic behavior, Cognitive impairment,  Developmental delay, Hypertension, Mental developmental delay, Mixed hyperlipidemia (01/20/2021), and Prediabetes (01/21/2021).   He has no past surgical history on file.   His family history includes Depression in his maternal grandmother and mother; Heart Problems in his father; Obesity in his father.He reports that he has never smoked. He has never used smokeless tobacco. He reports that he does not drink alcohol and does not use drugs.  Current Outpatient Medications on File Prior to Visit  Medication Sig Dispense Refill   blood glucose meter kit and supplies KIT Dispense based on patient and insurance preference. Use up to four times daily as directed. (FOR ICD-10 : E11.9 1 each PRN   Multiple Vitamins-Minerals (MULTIVITAMIN WITH MINERALS) tablet Take 1 tablet by mouth daily.     polyethylene glycol (MIRALAX / GLYCOLAX) 17 g packet Take 17 g by mouth daily as needed.     propranolol ER (INDERAL LA) 80 MG 24 hr capsule Take 1 capsule (80 mg total) by mouth daily. 90 capsule 1   Vitamin D, Ergocalciferol, (DRISDOL) 1.25 MG (50000 UNIT) CAPS capsule Take 1 capsule (50,000 Units total) by mouth every 7 (seven) days. 13 capsule  3   No current facility-administered medications on file prior to visit.    ROS Review of Systems  Constitutional:  Negative for fever.  Respiratory:  Negative for shortness of breath.   Cardiovascular:  Negative for chest pain.  Musculoskeletal:  Negative for arthralgias.  Skin:  Negative for rash.    Objective:  BP 138/82   Pulse 90   Temp (!) 97.3 F (36.3 C)   Ht 5' 4.5" (1.638 m)   Wt 208 lb 9.6 oz (94.6 kg)   SpO2 97%   BMI 35.25 kg/m   BP Readings from Last 3 Encounters:  12/13/22 138/82  11/08/22 129/87  09/27/22 119/78    Wt Readings from Last 3 Encounters:  12/13/22 208 lb 9.6 oz (94.6 kg)  11/08/22 213 lb (96.6 kg)  09/27/22 216 lb 3.2 oz (98.1 kg)     Physical Exam Vitals reviewed.  Constitutional:      Appearance: He is  well-developed.  HENT:     Head: Normocephalic and atraumatic.     Right Ear: External ear normal.     Left Ear: External ear normal.     Mouth/Throat:     Pharynx: No oropharyngeal exudate or posterior oropharyngeal erythema.  Eyes:     Pupils: Pupils are equal, round, and reactive to light.  Cardiovascular:     Rate and Rhythm: Normal rate and regular rhythm.     Heart sounds: No murmur heard. Pulmonary:     Effort: No respiratory distress.     Breath sounds: Normal breath sounds.  Musculoskeletal:     Cervical back: Normal range of motion and neck supple.  Neurological:     Mental Status: He is alert and oriented to person, place, and time.  Psychiatric:        Attention and Perception: Attention and perception normal.     Comments: Unusual affect        Assessment & Plan:   There are no diagnoses linked to this encounter.    I have changed Tamela Oddi. Braud's topiramate and metFORMIN. I am also having him maintain his multivitamin with minerals, polyethylene glycol, propranolol ER, Vitamin D (Ergocalciferol), and blood glucose meter kit and supplies.  Meds ordered this encounter  Medications   topiramate (TOPAMAX) 25 MG tablet    Sig: Take 2 nightly for 2 weeks.  Then 3 nightly for 2 weeks.  Then 4 nightly.    Dispense:  120 tablet    Refill:  0   metFORMIN (GLUCOPHAGE-XR) 500 MG 24 hr tablet    Sig: Take 1 tablet (500 mg total) by mouth 2 (two) times daily with a meal.    Dispense:  60 tablet    Refill:  2     Follow-up: Return in about 1 month (around 01/11/2023).  Claretta Fraise, M.D.

## 2022-12-23 ENCOUNTER — Other Ambulatory Visit: Payer: Self-pay | Admitting: Family Medicine

## 2022-12-23 DIAGNOSIS — R000 Tachycardia, unspecified: Secondary | ICD-10-CM

## 2023-01-11 ENCOUNTER — Ambulatory Visit: Payer: Medicaid Other | Admitting: Family Medicine

## 2023-01-11 ENCOUNTER — Encounter: Payer: Self-pay | Admitting: Family Medicine

## 2023-01-11 VITALS — BP 113/78 | HR 84 | Temp 97.3°F | Ht 64.5 in | Wt 206.0 lb

## 2023-01-11 DIAGNOSIS — E119 Type 2 diabetes mellitus without complications: Secondary | ICD-10-CM

## 2023-01-11 MED ORDER — LANCET DEVICE MISC: 1.0000 | Freq: Three times a day (TID) | 0 refills | Status: AC

## 2023-01-11 MED ORDER — BLOOD GLUCOSE MONITORING SUPPL DEVI: 1.0000 | Freq: Three times a day (TID) | 0 refills | Status: AC

## 2023-01-11 MED ORDER — BLOOD GLUCOSE TEST VI STRP: 1.0000 | ORAL_STRIP | Freq: Two times a day (BID) | 5 refills | Status: AC

## 2023-01-11 MED ORDER — LANCETS MISC. MISC: 1.0000 | Freq: Three times a day (TID) | 0 refills | Status: AC

## 2023-01-11 NOTE — Progress Notes (Signed)
Subjective:  Patient ID: Peter Bradshaw, male    DOB: 1996-08-08  Age: 27 y.o. MRN: [id number]  CC: Follow-up   HPI Peter Bradshaw presents forFollow-up of diabetes. Patient denies symptoms such as polyuria, polydipsia, excessive hunger, nausea No significant hypoglycemic spells noted. Medications reviewed. Pt reports taking them regularly without complication/adverse reaction being reported today.   Topiramate. Taking 2 and having a little morning drowsiness. Still getting HA. Kind of better.   History Peter Bradshaw has a past medical history of ADHD (attention deficit hyperactivity disorder), Anxiety, Autism, Autistic behavior, Cognitive impairment, Developmental delay, Hypertension, Mental developmental delay, Mixed hyperlipidemia (01/20/2021), and Prediabetes (01/21/2021).   Peter Bradshaw has no past surgical history on file.   His family history includes Depression in his maternal grandmother and mother; Heart Problems in his father; Obesity in his father.Peter Bradshaw reports that Peter Bradshaw has never smoked. Peter Bradshaw has never used smokeless tobacco. Peter Bradshaw reports that Peter Bradshaw does not drink alcohol and does not use drugs.  Current Outpatient Medications on File Prior to Visit  Medication Sig Dispense Refill   blood glucose meter kit and supplies KIT Dispense based on patient and insurance preference. Use up to four times daily as directed. (FOR ICD-10 : E11.9 1 each PRN   metFORMIN (GLUCOPHAGE-XR) 500 MG 24 hr tablet Take 1 tablet (500 mg total) by mouth 2 (two) times daily with a meal. 60 tablet 2   Multiple Vitamins-Minerals (MULTIVITAMIN WITH MINERALS) tablet Take 1 tablet by mouth daily.     polyethylene glycol (MIRALAX / GLYCOLAX) 17 g packet Take 17 g by mouth daily as needed.     propranolol ER (INDERAL LA) 80 MG 24 hr capsule TAKE 1 CAPSULE BY MOUTH EVERY DAY 90 capsule 1   topiramate (TOPAMAX) 25 MG tablet Take 2 nightly for 2 weeks.  Then 3 nightly for 2 weeks.  Then 4 nightly. 120 tablet 0   Vitamin D, Ergocalciferol,  (DRISDOL) 1.25 MG (50000 UNIT) CAPS capsule Take 1 capsule (50,000 Units total) by mouth every 7 (seven) days. 13 capsule 3   No current facility-administered medications on file prior to visit.    ROS Review of Systems  Constitutional:  Negative for fever.  Respiratory:  Negative for shortness of breath.   Cardiovascular:  Negative for chest pain.  Musculoskeletal:  Negative for arthralgias.  Skin:  Negative for rash.  Neurological:  Positive for headaches.    Objective:  BP 113/78   Pulse 84   Temp (!) 97.3 F (36.3 C)   Ht 5' 4.5" (1.638 m)   Wt 206 lb (93.4 kg)   SpO2 96%   BMI 34.81 kg/m   BP Readings from Last 3 Encounters:  01/11/23 113/78  12/13/22 138/82  11/08/22 129/87    Wt Readings from Last 3 Encounters:  01/11/23 206 lb (93.4 kg)  12/13/22 208 lb 9.6 oz (94.6 kg)  11/08/22 213 lb (96.6 kg)     Physical Exam Vitals reviewed.  Constitutional:      Appearance: Peter Bradshaw is well-developed.  HENT:     Head: Normocephalic and atraumatic.     Right Ear: External ear normal.     Left Ear: External ear normal.     Mouth/Throat:     Pharynx: No oropharyngeal exudate or posterior oropharyngeal erythema.  Eyes:     Pupils: Pupils are equal, round, and reactive to light.  Cardiovascular:     Rate and Rhythm: Normal rate and regular rhythm.     Heart sounds: No murmur heard.  Pulmonary:     Effort: No respiratory distress.     Breath sounds: Normal breath sounds.  Musculoskeletal:     Cervical back: Normal range of motion and neck supple.  Neurological:     Mental Status: Peter Bradshaw is alert and oriented to person, place, and time.       Assessment & Plan:   Peter Bradshaw was seen today for follow-up.  Diagnoses and all orders for this visit:  Diabetes mellitus, new onset ([place])  Other orders -     Blood Glucose Monitoring Suppl DEVI; 1 each by Does not apply route in the morning, at noon, and at bedtime. May substitute to any manufacturer covered by patient's  insurance. -     Glucose Blood (BLOOD GLUCOSE TEST STRIPS) STRP; 1 each by In Vitro route 2 (two) times daily at 10 AM and 5 PM. May substitute to any manufacturer covered by patient's insurance. -     Lancet Device MISC; 1 each by Does not apply route in the morning, at noon, and at bedtime. May substitute to any manufacturer covered by patient's insurance. -     Lancets Misc. MISC; 1 each by Does not apply route in the morning, at noon, and at bedtime. May substitute to any manufacturer covered by patient's insurance.      I am having Tamela Oddi. Bui start on Blood Glucose Monitoring Suppl, BLOOD GLUCOSE TEST STRIPS, Lancet Device, and Lancets Misc.. I am also having him maintain his multivitamin with minerals, polyethylene glycol, Vitamin D (Ergocalciferol), blood glucose meter kit and supplies, topiramate, metFORMIN, and propranolol ER.  Meds ordered this encounter  Medications   Blood Glucose Monitoring Suppl DEVI    Sig: 1 each by Does not apply route in the morning, at noon, and at bedtime. May substitute to any manufacturer covered by patient's insurance.    Dispense:  1 each    Refill:  0   Glucose Blood (BLOOD GLUCOSE TEST STRIPS) STRP    Sig: 1 each by In Vitro route 2 (two) times daily at 10 AM and 5 PM. May substitute to any manufacturer covered by patient's insurance.    Dispense:  100 strip    Refill:  5   Lancet Device MISC    Sig: 1 each by Does not apply route in the morning, at noon, and at bedtime. May substitute to any manufacturer covered by patient's insurance.    Dispense:  1 each    Refill:  0   Lancets Misc. MISC    Sig: 1 each by Does not apply route in the morning, at noon, and at bedtime. May substitute to any manufacturer covered by patient's insurance.    Dispense:  100 each    Refill:  0     Follow-up: Return in about 1 month (around 02/11/2023).  Claretta Fraise, M.D.

## 2023-02-21 ENCOUNTER — Encounter: Payer: Self-pay | Admitting: Family Medicine

## 2023-02-21 ENCOUNTER — Ambulatory Visit (INDEPENDENT_AMBULATORY_CARE_PROVIDER_SITE_OTHER): Payer: Medicaid Other | Admitting: Family Medicine

## 2023-02-21 VITALS — BP 115/68 | HR 85 | Temp 96.8°F | Ht 64.5 in | Wt 204.2 lb

## 2023-02-21 DIAGNOSIS — M41115 Juvenile idiopathic scoliosis, thoracolumbar region: Secondary | ICD-10-CM | POA: Diagnosis not present

## 2023-02-21 DIAGNOSIS — Z7984 Long term (current) use of oral hypoglycemic drugs: Secondary | ICD-10-CM

## 2023-02-21 DIAGNOSIS — E782 Mixed hyperlipidemia: Secondary | ICD-10-CM | POA: Diagnosis not present

## 2023-02-21 DIAGNOSIS — E119 Type 2 diabetes mellitus without complications: Secondary | ICD-10-CM | POA: Diagnosis not present

## 2023-02-21 LAB — BAYER DCA HB A1C WAIVED: HB A1C (BAYER DCA - WAIVED): 6.1 % — ABNORMAL HIGH (ref 4.8–5.6)

## 2023-02-21 MED ORDER — DICLOFENAC SODIUM 75 MG PO TBEC: 75.0000 mg | DELAYED_RELEASE_TABLET | Freq: Two times a day (BID) | ORAL | 2 refills | Status: DC

## 2023-02-21 MED ORDER — TOPIRAMATE 100 MG PO TABS: 100.0000 mg | ORAL_TABLET | Freq: Every day | ORAL | 1 refills | Status: DC

## 2023-02-21 NOTE — Patient Instructions (Signed)
Hypoglycemia Hypoglycemia occurs when the level of sugar (glucose) in the blood is too low. Hypoglycemia can happen in people who have or do not have diabetes. It can develop quickly, and it can be a medical emergency. For most people, a blood glucose level below 70 mg/dL (3.9 mmol/L) is considered hypoglycemia. Glucose is a type of sugar that provides the body's main source of energy. Certain hormones (insulin and glucagon) control the level of glucose in the blood. Insulin lowers blood glucose, and glucagon raises blood glucose. Hypoglycemia can result from having too much insulin in the bloodstream, or from not eating enough food that contains glucose. You may also have reactive hypoglycemia, which happens within 4 hours after eating a meal. What are the causes? Hypoglycemia occurs most often in people who have diabetes and may be caused by: Diabetes medicine. Not eating enough, or not eating often enough. Increased physical activity. Drinking alcohol on an empty stomach. If you do not have diabetes, hypoglycemia may be caused by: A tumor in the pancreas. Not eating enough, or not eating for long periods at a time (fasting). A severe infection or illness. Problems after having bariatric surgery. Organ failure, such as kidney or liver failure. Certain medicines. What increases the risk? Hypoglycemia is more likely to develop in people who: Have diabetes and take medicines to lower blood glucose. Abuse alcohol. Have a severe illness. What are the signs or symptoms? Symptoms vary depending on whether the condition is mild, moderate, or severe. Mild hypoglycemia Hunger. Sweating and feeling clammy. Dizziness or feeling light-headed. Sleepiness or restless sleep. Nausea. Increased heart rate. Headache. Blurry vision. Mood changes, such as irritability or anxiety. Tingling or numbness around the mouth, lips, or tongue. Moderate hypoglycemia Confusion and poor judgment. Behavior  changes. Weakness. Irregular heartbeat. A change in coordination. Severe hypoglycemia Severe hypoglycemia is a medical emergency. It can cause: Fainting. Seizures. Loss of consciousness (coma). Death. How is this diagnosed? Hypoglycemia is diagnosed with a blood test to measure your blood glucose level. This blood test is done while you are having symptoms. Your health care provider may also do a physical exam and review your medical history. How is this treated? This condition can be treated by immediately eating or drinking something that contains sugar with 15 grams of fast-acting carbohydrate, such as: 4 oz (120 mL) of fruit juice. 4 oz (120 mL) of regular soda (not diet soda). Several pieces of hard candy. Check food labels to find out how many pieces to eat for 15 grams. 1 Tbsp (15 mL) of sugar or honey. 4 glucose tablets. 1 tube of glucose gel. Treating hypoglycemia if you have diabetes If you are alert and able to swallow safely, follow the 15:15 rule: Take 15 grams of a fast-acting carbohydrate. Talk with your health care provider about how much you should take. Options for getting 15 grams of fast-acting carbohydrate include: Glucose tablets (take 4 tablets). Several pieces of hard candy. Check food labels to find out how many pieces to eat for 15 grams. 4 oz (120 mL) of fruit juice. 4 oz (120 mL) of regular soda (not diet soda). 1 Tbsp (15 mL) of sugar or honey. 1 tube of glucose gel. Check your blood glucose 15 minutes after you take the carbohydrate. If the repeat blood glucose level is still at or below 70 mg/dL (3.9 mmol/L), take 15 grams of a carbohydrate again. If your blood glucose level does not increase above 70 mg/dL (3.9 mmol/L) after 3 tries, seek emergency   medical care. After your blood glucose level returns to normal, eat a meal or a snack within 1 hour.  Treating severe hypoglycemia Severe hypoglycemia is when your blood glucose level is below 54 mg/dL (3  mmol/L). Severe hypoglycemia is a medical emergency. Get medical help right away. If you have severe hypoglycemia and you cannot eat or drink, you will need to be given glucagon. A family member or close friend should learn how to check your blood glucose and how to give you glucagon. Ask your health care provider if you need to have an emergency glucagon kit available. Severe hypoglycemia may need to be treated in a hospital. The treatment may include getting glucose through an IV. You may also need treatment for the cause of your hypoglycemia. Follow these instructions at home:  General instructions Take over-the-counter and prescription medicines only as told by your health care provider. Monitor your blood glucose as told by your health care provider. If you drink alcohol: Limit how much you have to: 0-1 drink a day for women who are not pregnant. 0-2 drinks a day for men. Know how much alcohol is in your drink. In the [place] one drink equals one 12 oz bottle of beer (355 mL), one 5 oz glass of wine (148 mL), or one 1 oz glass of hard liquor (44 mL). Be sure to eat food along with drinking alcohol. Be aware that alcohol is absorbed quickly and may have lingering effects that may result in hypoglycemia later. Be sure to do ongoing glucose monitoring. Keep all follow-up visits. This is important. If you have diabetes: Always have a fast-acting carbohydrate (15 grams) option with you to treat low blood glucose. Follow your diabetes management plan as directed by your health care provider. Make sure you: Know the symptoms of hypoglycemia. It is important to treat it right away to prevent it from becoming severe. Check your blood glucose as often as told. Always check before and after exercise. Always check your blood glucose before you drive a motorized vehicle. Take your medicines as told. Follow your meal plan. Eat on time, and do not skip meals. Share your diabetes management plan with  people in your workplace, school, and household. Carry a medical alert card or wear medical alert jewelry. Where to find more information American Diabetes Association: www.diabetes.org Contact a health care provider if: You have problems keeping your blood glucose in your target range. You have frequent episodes of hypoglycemia. Get help right away if: You continue to have hypoglycemia symptoms after eating or drinking something that contains 15 grams of fast-acting carbohydrate, and you cannot get your blood glucose above 70 mg/dL (3.9 mmol/L) while following the 15:15 rule. Your blood glucose is below 54 mg/dL (3 mmol/L). You have a seizure. You faint. These symptoms may represent a serious problem that is an emergency. Do not wait to see if the symptoms will go away. Get medical help right away. Call your local emergency services (911 in the [place] Do not drive yourself to the hospital. Summary Hypoglycemia occurs when the level of sugar (glucose) in the blood is too low. Hypoglycemia can happen in people who have or do not have diabetes. It can develop quickly, and it can be a medical emergency. Make sure you know the symptoms of hypoglycemia and how to treat it. Always have a fast-acting carbohydrate option with you to treat low blood sugar. This information is not intended to replace advice given to you by your health care provider.   Make sure you discuss any questions you have with your health care provider. Document Revised: 09/04/2020 Document Reviewed: 09/04/2020 [employer] Patient Education  2023 [employer]  

## 2023-02-21 NOTE — Progress Notes (Signed)
Subjective:  Patient ID: Peter Bradshaw, male    DOB: 1996-06-04  Age: 27 y.o. MRN: [id number]  CC: Medical Management of Chronic Issues   HPI Peter Bradshaw presents forFollow-up of diabetes. Patient checks blood sugar at home.  Forgot to bring his sugar readings. "Having a midlife crisis." Patient denies symptoms such as polyuria, polydipsia, excessive hunger, nausea No significant hypoglycemic spells noted. Medications reviewed. Pt reports taking them regularly without complication/adverse reaction being reported today.   Having back pain due to increased pain from his scoliosis.  Taking 3 topiramate a day. Still some Has, but heat triggers them.  History Peter Bradshaw has a past medical history of ADHD (attention deficit hyperactivity disorder), Anxiety, Autism, Autistic behavior, Cognitive impairment, Developmental delay, Hypertension, Mental developmental delay, Mixed hyperlipidemia (01/20/2021), and Prediabetes (01/21/2021).   Peter Bradshaw has no past surgical history on file.   His family history includes Depression in his maternal grandmother and mother; Heart Problems in his father; Obesity in his father.Peter Bradshaw reports that Peter Bradshaw has never smoked. Peter Bradshaw has never used smokeless tobacco. Peter Bradshaw reports that Peter Bradshaw does not drink alcohol and does not use drugs.  Current Outpatient Medications on File Prior to Visit  Medication Sig Dispense Refill   blood glucose meter kit and supplies KIT Dispense based on patient and insurance preference. Use up to four times daily as directed. (FOR ICD-10 : E11.9 1 each PRN   Blood Glucose Monitoring Suppl DEVI 1 each by Does not apply route in the morning, at noon, and at bedtime. May substitute to any manufacturer covered by patient's insurance. 1 each 0   Glucose Blood (BLOOD GLUCOSE TEST STRIPS) STRP 1 each by In Vitro route 2 (two) times daily at 10 AM and 5 PM. May substitute to any manufacturer covered by patient's insurance. 100 strip 5   metFORMIN (GLUCOPHAGE-XR) 500 MG 24 hr  tablet Take 1 tablet (500 mg total) by mouth 2 (two) times daily with a meal. 60 tablet 2   Multiple Vitamins-Minerals (MULTIVITAMIN WITH MINERALS) tablet Take 1 tablet by mouth daily.     polyethylene glycol (MIRALAX / GLYCOLAX) 17 g packet Take 17 g by mouth daily as needed.     propranolol ER (INDERAL LA) 80 MG 24 hr capsule TAKE 1 CAPSULE BY MOUTH EVERY DAY 90 capsule 1   Vitamin D, Ergocalciferol, (DRISDOL) 1.25 MG (50000 UNIT) CAPS capsule Take 1 capsule (50,000 Units total) by mouth every 7 (seven) days. 13 capsule 3   No current facility-administered medications on file prior to visit.    ROS Review of Systems  Constitutional:  Negative for fever.  Respiratory:  Negative for shortness of breath.   Cardiovascular:  Negative for chest pain.  Musculoskeletal:  Positive for arthralgias and back pain.  Skin:  Negative for rash.  Neurological:  Positive for headaches.    Objective:  BP 115/68   Pulse 85   Temp (!) 96.8 F (36 C)   Ht 5' 4.5" (1.638 m)   Wt 204 lb 3.2 oz (92.6 kg)   SpO2 97%   BMI 34.51 kg/m   BP Readings from Last 3 Encounters:  02/21/23 115/68  01/11/23 113/78  12/13/22 138/82    Wt Readings from Last 3 Encounters:  02/21/23 204 lb 3.2 oz (92.6 kg)  01/11/23 206 lb (93.4 kg)  12/13/22 208 lb 9.6 oz (94.6 kg)     Physical Exam Vitals reviewed.  Constitutional:      Appearance: Peter Bradshaw is well-developed.  HENT:  Head: Normocephalic and atraumatic.     Right Ear: External ear normal.     Left Ear: External ear normal.     Mouth/Throat:     Pharynx: No oropharyngeal exudate or posterior oropharyngeal erythema.  Eyes:     Pupils: Pupils are equal, round, and reactive to light.  Cardiovascular:     Rate and Rhythm: Normal rate and regular rhythm.     Heart sounds: No murmur heard. Pulmonary:     Effort: No respiratory distress.     Breath sounds: Normal breath sounds.  Musculoskeletal:     Cervical back: Normal range of motion and neck supple.   Neurological:     Mental Status: Peter Bradshaw is alert and oriented to person, place, and time.       Assessment & Plan:   Peter Bradshaw was seen today for medical management of chronic issues.  Diagnoses and all orders for this visit:  Diabetes mellitus, new onset (HCC) -     Bayer DCA Hb A1c Waived -     CBC with Differential/Platelet -     CMP14+EGFR  Hypercholesterolemia with hypertriglyceridemia -     Lipid panel  Juvenile idiopathic scoliosis of thoracolumbar region -     Ambulatory referral to Orthopedics  Other orders -     diclofenac (VOLTAREN) 75 MG EC tablet; Take 1 tablet (75 mg total) by mouth 2 (two) times daily. For muscle and  Joint pain -     topiramate (TOPAMAX) 100 MG tablet; Take 1 tablet (100 mg total) by mouth at bedtime.      I have changed Modesta Messing. Ayuso's topiramate. I am also having him start on diclofenac. Additionally, I am having him maintain his multivitamin with minerals, polyethylene glycol, Vitamin D (Ergocalciferol), blood glucose meter kit and supplies, metFORMIN, propranolol ER, Blood Glucose Monitoring Suppl, and BLOOD GLUCOSE TEST STRIPS.  Meds ordered this encounter  Medications   diclofenac (VOLTAREN) 75 MG EC tablet    Sig: Take 1 tablet (75 mg total) by mouth 2 (two) times daily. For muscle and  Joint pain    Dispense:  60 tablet    Refill:  2   topiramate (TOPAMAX) 100 MG tablet    Sig: Take 1 tablet (100 mg total) by mouth at bedtime.    Dispense:  90 tablet    Refill:  1     Follow-up: Return in about 3 months (around 05/24/2023).  Mechele Claude, M.D.

## 2023-02-22 LAB — CMP14+EGFR
ALT: 44 IU/L (ref 0–44)
AST: 33 IU/L (ref 0–40)
Albumin/Globulin Ratio: 1.7 (ref 1.2–2.2)
Albumin: 4.9 g/dL (ref 4.3–5.2)
Alkaline Phosphatase: 59 IU/L (ref 44–121)
BUN/Creatinine Ratio: 15 (ref 9–20)
BUN: 20 mg/dL (ref 6–20)
Bilirubin Total: 0.4 mg/dL (ref 0.0–1.2)
CO2: 20 mmol/L (ref 20–29)
Calcium: 10.5 mg/dL — ABNORMAL HIGH (ref 8.7–10.2)
Chloride: 104 mmol/L (ref 96–106)
Creatinine, Ser: 1.3 mg/dL — ABNORMAL HIGH (ref 0.76–1.27)
Globulin, Total: 2.9 g/dL (ref 1.5–4.5)
Glucose: 104 mg/dL — ABNORMAL HIGH (ref 70–99)
Potassium: 4.6 mmol/L (ref 3.5–5.2)
Sodium: 144 mmol/L (ref 134–144)
Total Protein: 7.8 g/dL (ref 6.0–8.5)
eGFR: 78 mL/min/{1.73_m2} (ref >59)

## 2023-02-22 LAB — CBC WITH DIFFERENTIAL/PLATELET
Basophils Absolute: 0.1 10*3/uL (ref 0.0–0.2)
Basos: 1 %
EOS (ABSOLUTE): 0.1 10*3/uL (ref 0.0–0.4)
Eos: 2 %
Hematocrit: 46.4 % (ref 37.5–51.0)
Hemoglobin: 16.2 g/dL (ref 13.0–17.7)
Immature Grans (Abs): 0 10*3/uL (ref 0.0–0.1)
Immature Granulocytes: 1 %
Lymphocytes Absolute: 1.8 10*3/uL (ref 0.7–3.1)
Lymphs: 21 %
MCH: 31.1 pg (ref 26.6–33.0)
MCHC: 34.9 g/dL (ref 31.5–35.7)
MCV: 89 fL (ref 79–97)
Monocytes Absolute: 0.7 10*3/uL (ref 0.1–0.9)
Monocytes: 8 %
Neutrophils Absolute: 5.9 10*3/uL (ref 1.4–7.0)
Neutrophils: 67 %
Platelets: 308 10*3/uL (ref 150–450)
RBC: 5.21 x10E6/uL (ref 4.14–5.80)
RDW: 13.6 % (ref 11.6–15.4)
WBC: 8.6 10*3/uL (ref 3.4–10.8)

## 2023-02-22 LAB — LIPID PANEL
Chol/HDL Ratio: 7.1 ratio — ABNORMAL HIGH (ref 0.0–5.0)
Cholesterol, Total: 198 mg/dL (ref 100–199)
HDL: 28 mg/dL — ABNORMAL LOW (ref >39)
LDL Chol Calc (NIH): 99 mg/dL (ref 0–99)
Triglycerides: 421 mg/dL — ABNORMAL HIGH (ref 0–149)
VLDL Cholesterol Cal: 71 mg/dL — ABNORMAL HIGH (ref 5–40)

## 2023-02-23 ENCOUNTER — Other Ambulatory Visit: Payer: Self-pay | Admitting: Family Medicine

## 2023-02-23 MED ORDER — ROSUVASTATIN CALCIUM 10 MG PO TABS
10.0000 mg | ORAL_TABLET | Freq: Every day | ORAL | 1 refills | Status: DC
Start: 2023-02-23 — End: 2023-07-04

## 2023-03-07 ENCOUNTER — Telehealth: Payer: Self-pay

## 2023-03-07 ENCOUNTER — Other Ambulatory Visit (HOSPITAL_COMMUNITY): Payer: Self-pay

## 2023-03-07 NOTE — Telephone Encounter (Signed)
DICLOFENAC IS NON PREFERRED FOR MEDICAID, I DO NOT SEE ANY FAILURES FOR ALTERNATIVES  PREFERRED ALTERNATIVES FOR MEDICAID ARE CELEBREX, NAPROXEN, MELOXICAM, CAN YOU PLEASE CHANGE TO ONE OF THESE PREFERRED MEDS?

## 2023-03-07 NOTE — Telephone Encounter (Signed)
PA request received via CMM for Diclofenac Sodium 75MG  dr tablets  PA not submitted due to no notation of alternatives tried and failed  PA will need to be done through [facility] Tracks if appropriate

## 2023-03-09 NOTE — Telephone Encounter (Signed)
Left message for pt to return call.  Need to see if pt is alright on waiting for Dr. Darlyn Read' recommendations until next week. If not message needs to be sent to Dr. Louanne Skye for advise since his is covering today.

## 2023-03-09 NOTE — Telephone Encounter (Signed)
Please send to PCP and they can decide what patient can go on in the future unless she is going to run out before he returns week

## 2023-03-22 ENCOUNTER — Other Ambulatory Visit: Payer: Self-pay | Admitting: Family Medicine

## 2023-03-22 MED ORDER — MELOXICAM 15 MG PO TABS: 15.0000 mg | ORAL_TABLET | Freq: Every day | ORAL | 5 refills | Status: AC

## 2023-03-22 NOTE — Telephone Encounter (Signed)
Please let the patient know that I sent their prescription to their pharmacy. Thanks, WS 

## 2023-03-28 NOTE — Telephone Encounter (Signed)
Pt dad r/c

## 2023-03-28 NOTE — Telephone Encounter (Signed)
CALLED PATIENT, NO ANSWER, LEFT MESSAGE TO RETURN CALL 

## 2023-03-29 NOTE — Telephone Encounter (Signed)
Pts guardian is aware and says he already picked up the medication at the pharmacy, yesterday. Says insurance did not cover all of it but he paid the difference. Says pt takes this medicine on PRN basis.

## 2023-04-28 ENCOUNTER — Other Ambulatory Visit: Payer: Self-pay | Admitting: Family Medicine

## 2023-05-21 ENCOUNTER — Other Ambulatory Visit: Payer: Self-pay | Admitting: Family Medicine

## 2023-07-02 ENCOUNTER — Other Ambulatory Visit: Payer: Self-pay | Admitting: Family Medicine

## 2023-08-29 ENCOUNTER — Other Ambulatory Visit: Payer: Self-pay | Admitting: Family Medicine

## 2023-09-01 ENCOUNTER — Other Ambulatory Visit: Payer: Self-pay | Admitting: Family Medicine

## 2023-09-01 NOTE — Telephone Encounter (Signed)
Stacks NTBS 30-days was given back in Aug

## 2023-09-02 ENCOUNTER — Encounter: Payer: Self-pay | Admitting: Family Medicine

## 2023-09-02 NOTE — Telephone Encounter (Signed)
Letter sent.

## 2023-10-17 ENCOUNTER — Telehealth: Payer: Self-pay | Admitting: Family Medicine

## 2023-10-17 ENCOUNTER — Other Ambulatory Visit: Payer: Self-pay | Admitting: Family Medicine

## 2023-10-17 ENCOUNTER — Other Ambulatory Visit: Payer: Self-pay | Admitting: *Deleted

## 2023-10-17 ENCOUNTER — Telehealth: Payer: Self-pay

## 2023-10-17 NOTE — Telephone Encounter (Signed)
Copied from CRM [id number]. Topic: Clinical - Medication Refill >> Oct 17, 2023  9:52 AM Fuller Mandril wrote: Most Recent Primary Care Visit:  Provider: Mechele Claude  Department: [facility]  Visit Type: OFFICE VISIT  Date: 02/21/2023  Medication:  rosuvastatin (CRESTOR) 10 MG tablet Vitamin D, Ergocalciferol, (DRISDOL) 1.25 MG (50000 UNIT) CAPS capsule  Has the patient contacted their pharmacy? Yes (Agent: If no, request that the patient contact the pharmacy for the refill. If patient does not wish to contact the pharmacy document the reason why and proceed with request.) (Agent: If yes, when and what did the pharmacy advise?)  Is this the correct pharmacy for this prescription? Yes If no, delete pharmacy and type the correct one.  This is the patient's preferred pharmacy:  [id number] - [place], [place] - [address] [place] [address] Phone: [phone] Fax: [phone]   Has the prescription been filled recently? No  Is the patient out of the medication? Yes  Has the patient been seen for an appointment in the last year OR does the patient have an upcoming appointment? Yes  Can we respond through MyChart? No  Agent: Please be advised that Rx refills may take up to 3 business days. We ask that you follow-up with your pharmacy.

## 2023-10-17 NOTE — Telephone Encounter (Signed)
Refer to previous messages- rx denied needs appointment with provider

## 2023-10-18 ENCOUNTER — Other Ambulatory Visit: Payer: Self-pay | Admitting: Family Medicine

## 2023-10-18 DIAGNOSIS — R000 Tachycardia, unspecified: Secondary | ICD-10-CM

## 2023-11-10 ENCOUNTER — Telehealth: Payer: Self-pay | Admitting: Family Medicine

## 2023-11-10 ENCOUNTER — Ambulatory Visit (INDEPENDENT_AMBULATORY_CARE_PROVIDER_SITE_OTHER): Payer: MEDICAID | Admitting: Family Medicine

## 2023-11-10 NOTE — Telephone Encounter (Signed)
Pts guardian requesting to switch providers. Does not want pt to see Dr Darlyn Read anymore.  Are any of the providers that are taking new patients, open to having pt establish care with them?

## 2023-11-11 NOTE — Telephone Encounter (Signed)
Okay. Not sure of the problem. Peter Bradshaw is a bit mentally challenged, but reasonable. Main problem is diabetes. Questionable compliance at times

## 2023-11-16 NOTE — Telephone Encounter (Signed)
Returned call to pt. Peter Bradshaw tried to get pt otp and I could hear pt say that he doesn't get along with Dr Darlyn Read. He said Dr Darlyn Read ask too many questions. Bobby asked pt to get otp but wouldn't.  Copied from CRM [id number]. Topic: Appointments - Scheduling Inquiry for Clinic >> Nov 16, 2023 10:03 AM Dennison Nancy wrote: Reason for CRM: following up to getting a different doctor

## 2023-11-17 NOTE — Telephone Encounter (Signed)
Ok for switch. However he will need to be scheduled in a 30 minute time slot on a day that I am not already overbooked with new patients/physicals. I think the "transfer of care" visit type is for these situations? Another provider may have openings sooner.

## 2023-11-18 NOTE — Telephone Encounter (Signed)
Appt scheduled for 01/19/2024 with Peter Bradshaw

## 2023-11-18 NOTE — Progress Notes (Signed)
 Erroneous visit

## 2023-12-05 ENCOUNTER — Other Ambulatory Visit: Payer: Self-pay | Admitting: Family Medicine

## 2023-12-05 MED ORDER — ROSUVASTATIN CALCIUM 10 MG PO TABS: 10.0000 mg | ORAL_TABLET | Freq: Every day | ORAL | 0 refills | Status: DC

## 2023-12-05 NOTE — Telephone Encounter (Signed)
 Copied from CRM [id number]. Topic: Clinical - Medication Refill >> Dec 05, 2023  9:38 AM Patsy Lager T wrote: Most Recent Primary Care Visit:  Provider: Mechele Claude  Department: [facility]  Visit Type: OFFICE VISIT  Date: 02/21/2023  Medication: rosuvastatin (CRESTOR) 10 MG tablet  Has the patient contacted their pharmacy? No  Is this the correct pharmacy for this prescription? Yes  This is the patient's preferred pharmacy:  [id number] - [place], [place] - [address] [place] [address] Phone: [phone] Fax: [phone]   Has the prescription been filled recently? Yes  Is the patient out of the medication? Yes  Has the patient been seen for an appointment in the last year OR does the patient have an upcoming appointment? Yes  Can we respond through MyChart? No  Agent: Please be advised that Rx refills may take up to 3 business days. We ask that you follow-up with your pharmacy.

## 2024-01-01 ENCOUNTER — Other Ambulatory Visit: Payer: Self-pay | Admitting: Family Medicine

## 2024-01-17 ENCOUNTER — Other Ambulatory Visit: Payer: Self-pay | Admitting: Family Medicine

## 2024-01-17 DIAGNOSIS — R000 Tachycardia, unspecified: Secondary | ICD-10-CM

## 2024-01-19 ENCOUNTER — Ambulatory Visit (INDEPENDENT_AMBULATORY_CARE_PROVIDER_SITE_OTHER): Payer: MEDICAID | Admitting: Family Medicine

## 2024-01-19 ENCOUNTER — Encounter: Payer: Self-pay | Admitting: Family Medicine

## 2024-01-19 VITALS — BP 116/79 | HR 113 | Temp 98.4°F | Ht 64.5 in | Wt 189.2 lb

## 2024-01-19 DIAGNOSIS — E669 Obesity, unspecified: Secondary | ICD-10-CM

## 2024-01-19 DIAGNOSIS — Z6831 Body mass index (BMI) 31.0-31.9, adult: Secondary | ICD-10-CM

## 2024-01-19 DIAGNOSIS — F840 Autistic disorder: Secondary | ICD-10-CM

## 2024-01-19 DIAGNOSIS — Z0001 Encounter for general adult medical examination with abnormal findings: Secondary | ICD-10-CM

## 2024-01-19 DIAGNOSIS — F411 Generalized anxiety disorder: Secondary | ICD-10-CM

## 2024-01-19 DIAGNOSIS — R351 Nocturia: Secondary | ICD-10-CM | POA: Insufficient documentation

## 2024-01-19 DIAGNOSIS — E782 Mixed hyperlipidemia: Secondary | ICD-10-CM | POA: Diagnosis not present

## 2024-01-19 DIAGNOSIS — E1165 Type 2 diabetes mellitus with hyperglycemia: Secondary | ICD-10-CM | POA: Diagnosis not present

## 2024-01-19 DIAGNOSIS — Z0000 Encounter for general adult medical examination without abnormal findings: Secondary | ICD-10-CM

## 2024-01-19 DIAGNOSIS — Z7984 Long term (current) use of oral hypoglycemic drugs: Secondary | ICD-10-CM

## 2024-01-19 DIAGNOSIS — R000 Tachycardia, unspecified: Secondary | ICD-10-CM | POA: Insufficient documentation

## 2024-01-19 DIAGNOSIS — E559 Vitamin D deficiency, unspecified: Secondary | ICD-10-CM | POA: Insufficient documentation

## 2024-01-19 DIAGNOSIS — F332 Major depressive disorder, recurrent severe without psychotic features: Secondary | ICD-10-CM

## 2024-01-19 LAB — BAYER DCA HB A1C WAIVED: HB A1C (BAYER DCA - WAIVED): 4.9 % (ref 4.8–5.6)

## 2024-01-19 LAB — LIPID PANEL

## 2024-01-19 MED ORDER — OXYBUTYNIN CHLORIDE ER 5 MG PO TB24: 5.0000 mg | ORAL_TABLET | Freq: Every day | ORAL | 3 refills | Status: DC

## 2024-01-19 NOTE — Progress Notes (Signed)
 Complete physical exam  Patient: Peter Bradshaw   DOB: 11-12-1995   27 y.o. Male  MRN: [id number]  Subjective:    Chief Complaint  Patient presents with   Annual Exam    Peter Bradshaw is a 28 y.o. male who presents today for a complete physical exam. He reports consuming a general diet. He has been walking daily. He generally feels well. He reports sleeping fairly well. He does have additional problems to discuss today.   Reports chronic nocturia. Struggles to sleep well at night because of it. Gets up 4x a night. No other symptoms. Would like to try a medication for this.   Reports anxiety and depression is at a high level. Denies SI, plan, or intent however. Declines medication as meds have increased his symptoms in the past.   Most recent fall risk assessment:    01/19/2024   10:28 AM  Fall Risk   Falls in the past year? 0     Most recent depression screenings:    01/19/2024   10:28 AM 02/21/2023    2:22 PM 02/21/2023    2:10 PM  Depression screen PHQ 2/9  Decreased Interest 3 2 0  Down, Depressed, Hopeless 0 2 0  PHQ - 2 Score 3 4 0  Altered sleeping 0 1   Tired, decreased energy 2 2   Change in appetite 2 0   Feeling bad or failure about yourself  2 2   Trouble concentrating 0 3   Moving slowly or fidgety/restless 1 0   Suicidal thoughts 1 0   PHQ-9 Score 11 12   Difficult doing work/chores Somewhat difficult Not difficult at all       01/19/2024   10:29 AM 02/21/2023    2:22 PM 12/13/2022    3:19 PM 11/08/2022    4:01 PM  GAD 7 : Generalized Anxiety Score  Nervous, Anxious, on Edge 1 1 2 2   Control/stop worrying 2 2 2 2   Worry too much - different things 3 2 2 2   Trouble relaxing 3 3 2 1   Restless 3 2 2 1   Easily annoyed or irritable 2 2 2 2   Afraid - awful might happen 3 1 1  0  Total GAD 7 Score 17 13 13 10   Anxiety Difficulty Not difficult at all Not difficult at all Not difficult at all Somewhat difficult          Patient Care Team: Gabriel Earing, FNP  as PCP - General (Family Medicine)   Outpatient Medications Prior to Visit  Medication Sig   blood glucose meter kit and supplies KIT Dispense based on patient and insurance preference. Use up to four times daily as directed. (FOR ICD-10 : E11.9   Blood Glucose Monitoring Suppl DEVI 1 each by Does not apply route in the morning, at noon, and at bedtime. May substitute to any manufacturer covered by patient's insurance.   Glucose Blood (BLOOD GLUCOSE TEST STRIPS) STRP 1 each by In Vitro route 2 (two) times daily at 10 AM and 5 PM. May substitute to any manufacturer covered by patient's insurance.   metFORMIN (GLUCOPHAGE-XR) 500 MG 24 hr tablet Take 1 tablet (500 mg total) by mouth 2 (two) times daily with a meal. **NEEDS TO BE SEEN BEFORE NEXT REFILL**   Multiple Vitamins-Minerals (MULTIVITAMIN WITH MINERALS) tablet Take 1 tablet by mouth daily.   polyethylene glycol (MIRALAX / GLYCOLAX) 17 g packet Take 17 g by mouth daily as needed.  propranolol ER (INDERAL LA) 80 MG 24 hr capsule TAKE 1 CAPSULE BY MOUTH EVERY DAY   rosuvastatin (CRESTOR) 10 MG tablet TAKE 1 TABLET (10 MG TOTAL) BY MOUTH DAILY. **NEEDS TO BE SEEN BEFORE NEXT REFILL**   topiramate (TOPAMAX) 100 MG tablet TAKE 1 TABLET BY MOUTH EVERYDAY AT BEDTIME   Vitamin D, Ergocalciferol, (DRISDOL) 1.25 MG (50000 UNIT) CAPS capsule TAKE 1 CAPSULE (50,000 UNITS TOTAL) BY MOUTH EVERY 7 (SEVEN) DAYS   meloxicam (MOBIC) 15 MG tablet Take 1 tablet (15 mg total) by mouth daily. For joint and muscle pain (Patient not taking: Reported on 01/19/2024)   No facility-administered medications prior to visit.    ROS Negative unless specially indicated above in HPI.     Objective:     BP 116/79   Pulse (!) 113   Temp 98.4 F (36.9 C) (Temporal)   Ht 5' 4.5" (1.638 m)   Wt 189 lb 3.2 oz (85.8 kg)   SpO2 95%   BMI 31.97 kg/m   Wt Readings from Last 3 Encounters:  01/19/24 189 lb 3.2 oz (85.8 kg)  02/21/23 204 lb 3.2 oz (92.6 kg)  01/11/23 206  lb (93.4 kg)   Pulse Readings from Last 3 Encounters:  01/19/24 (!) 113  02/21/23 85  01/11/23 84     Physical Exam Vitals and nursing note reviewed.  Constitutional:      General: He is not in acute distress.    Appearance: He is not ill-appearing.  HENT:     Head: Normocephalic.     Right Ear: Tympanic membrane, ear canal and external ear normal.     Left Ear: Tympanic membrane, ear canal and external ear normal.     Nose: Nose normal.     Mouth/Throat:     Mouth: Mucous membranes are moist.     Pharynx: Oropharynx is clear.  Eyes:     Extraocular Movements: Extraocular movements intact.     Conjunctiva/sclera: Conjunctivae normal.     Pupils: Pupils are equal, round, and reactive to light.  Cardiovascular:     Rate and Rhythm: Normal rate and regular rhythm.     Pulses: Normal pulses.     Heart sounds: Normal heart sounds. No murmur heard.    No friction rub. No gallop.  Pulmonary:     Effort: Pulmonary effort is normal.     Breath sounds: Normal breath sounds.  Abdominal:     General: Bowel sounds are normal. There is no distension.     Palpations: Abdomen is soft. There is no mass.     Tenderness: There is no abdominal tenderness. There is no guarding.  Musculoskeletal:     Cervical back: Normal range of motion and neck supple. No tenderness.     Right lower leg: No edema.     Left lower leg: No edema.  Skin:    General: Skin is warm and dry.     Capillary Refill: Capillary refill takes less than 2 seconds.     Findings: No lesion or rash.  Neurological:     General: No focal deficit present.     Mental Status: He is alert and oriented to person, place, and time.  Psychiatric:        Attention and Perception: Attention normal.        Mood and Affect: Affect is flat.        Speech: Speech normal.        Behavior: Behavior is cooperative.  No results found for any visits on 01/19/24.     Assessment & Plan:    Routine Health Maintenance and Physical  Exam  Peter Bradshaw was seen today for annual exam.  Diagnoses and all orders for this visit:  Routine general medical examination at a health care facility  Type 2 diabetes mellitus with hyperglycemia, without long-term current use of insulin (HCC) A1c at goal. ? Compliance with metformin. Requested that he bring medications to his next appt.  -     Bayer DCA Hb A1c Waived -     CBC with Differential/Platelet -     CMP14+EGFR -     Microalbumin / creatinine urine ratio -     Vitamin B12  Obesity (BMI 35.0-39.9 without comorbidity) -     TSH  Hypercholesterolemia with hypertriglyceridemia On statin.  -     Lipid panel  Nocturia Can try ditropan as below.  -     oxybutynin (DITROPAN-XL) 5 MG 24 hr tablet; Take 1 tablet (5 mg total) by mouth at bedtime.  Vitamin D deficiency -     VITAMIN D 25 Hydroxy (Vit-D Deficiency, Fractures)  Severe episode of recurrent major depressive disorder, without psychotic features (HCC) Generalized anxiety disorder Declined medications or referral today. Denies SI, plan, or intent.   Autism spectrum disorder  Tachycardia Has been out of propanolol. Previously well controlled with propanolol. He will pick this up today. Denies symptoms.    Immunization History  Administered Date(s) Administered   Pneumococcal Polysaccharide-23 03/04/2021    Health Maintenance  Topic Date Due   OPHTHALMOLOGY EXAM  Never done   Diabetic kidney evaluation - Urine ACR  03/04/2022   FOOT EXAM  03/04/2022   HEMOGLOBIN A1C  08/24/2023   DTaP/Tdap/Td (1 - Tdap) 01/18/2025 (Originally 09/07/2016)   Pneumococcal Vaccine 37-42 Years old (2 of 2 - PCV) 01/18/2025 (Originally 03/04/2022)   HPV VACCINES (1 - Male 3-dose series) 01/18/2025 (Originally 09/07/2012)   Hepatitis C Screening  01/18/2025 (Originally 09/08/2015)   HIV Screening  01/18/2025 (Originally 09/07/2012)   COVID-19 Vaccine (1 - 2024-25 season) 02/03/2025 (Originally 06/19/2023)   Diabetic kidney evaluation  - eGFR measurement  02/21/2024   INFLUENZA VACCINE  05/18/2024    Discussed health benefits of physical activity, and encouraged him to engage in regular exercise appropriate for his age and condition.  Problem List Items Addressed This Visit       Endocrine   Type 2 diabetes mellitus with hyperglycemia, without long-term current use of insulin (HCC)   Relevant Orders   Bayer DCA Hb A1c Waived (Completed)   CBC with Differential/Platelet   CMP14+EGFR   Microalbumin / creatinine urine ratio   Vitamin B12     Other   Autism spectrum disorder   Generalized anxiety disorder   Severe episode of recurrent major depressive disorder, without psychotic features (HCC)   Hypercholesterolemia with hypertriglyceridemia   Relevant Orders   Lipid panel   Obesity (BMI 35.0-39.9 without comorbidity)   Relevant Orders   TSH   Vitamin D deficiency   Relevant Orders   VITAMIN D 25 Hydroxy (Vit-D Deficiency, Fractures)   Nocturia   Relevant Medications   oxybutynin (DITROPAN-XL) 5 MG 24 hr tablet   Tachycardia   Other Visit Diagnoses       Routine general medical examination at a health care facility    -  Primary      Return in about 3 months (around 04/19/2024) for chronic follow up.   The patient indicates  understanding of these issues and agrees with the plan.  Gabriel Earing, FNP

## 2024-01-19 NOTE — Patient Instructions (Signed)
 Health Maintenance, Male  Adopting a healthy lifestyle and getting preventive care are important in promoting health and wellness. Ask your health care provider about:  The right schedule for you to have regular tests and exams.  Things you can do on your own to prevent diseases and keep yourself healthy.  What should I know about diet, weight, and exercise?  Eat a healthy diet    Eat a diet that includes plenty of vegetables, fruits, low-fat dairy products, and lean protein.  Do not eat a lot of foods that are high in solid fats, added sugars, or sodium.  Maintain a healthy weight  Body mass index (BMI) is a measurement that can be used to identify possible weight problems. It estimates body fat based on height and weight. Your health care provider can help determine your BMI and help you achieve or maintain a healthy weight.  Get regular exercise  Get regular exercise. This is one of the most important things you can do for your health. Most adults should:  Exercise for at least 150 minutes each week. The exercise should increase your heart rate and make you sweat (moderate-intensity exercise).  Do strengthening exercises at least twice a week. This is in addition to the moderate-intensity exercise.  Spend less time sitting. Even light physical activity can be beneficial.  Watch cholesterol and blood lipids  Have your blood tested for lipids and cholesterol at 27 years of age, then have this test every 5 years.  You may need to have your cholesterol levels checked more often if:  Your lipid or cholesterol levels are high.  You are older than 27 years of age.  You are at high risk for heart disease.  What should I know about cancer screening?  Many types of cancers can be detected early and may often be prevented. Depending on your health history and family history, you may need to have cancer screening at various ages. This may include screening for:  Colorectal cancer.  Prostate cancer.  Skin cancer.  Lung  cancer.  What should I know about heart disease, diabetes, and high blood pressure?  Blood pressure and heart disease  High blood pressure causes heart disease and increases the risk of stroke. This is more likely to develop in people who have high blood pressure readings or are overweight.  Talk with your health care provider about your target blood pressure readings.  Have your blood pressure checked:  Every 3-5 years if you are 9-95 years of age.  Every year if you are 85 years old or older.  If you are between the ages of 29 and 29 and are a current or former smoker, ask your health care provider if you should have a one-time screening for abdominal aortic aneurysm (AAA).  Diabetes  Have regular diabetes screenings. This checks your fasting blood sugar level. Have the screening done:  Once every three years after age 23 if you are at a normal weight and have a low risk for diabetes.  More often and at a younger age if you are overweight or have a high risk for diabetes.  What should I know about preventing infection?  Hepatitis B  If you have a higher risk for hepatitis B, you should be screened for this virus. Talk with your health care provider to find out if you are at risk for hepatitis B infection.  Hepatitis C  Blood testing is recommended for:  Everyone born from [birth year] through 1964.  Anyone  with known risk factors for hepatitis C.  Sexually transmitted infections (STIs)  You should be screened each year for STIs, including gonorrhea and chlamydia, if:  You are sexually active and are younger than 27 years of age.  You are older than 27 years of age and your health care provider tells you that you are at risk for this type of infection.  Your sexual activity has changed since you were last screened, and you are at increased risk for chlamydia or gonorrhea. Ask your health care provider if you are at risk.  Ask your health care provider about whether you are at high risk for HIV. Your health care provider  may recommend a prescription medicine to help prevent HIV infection. If you choose to take medicine to prevent HIV, you should first get tested for HIV. You should then be tested every 3 months for as long as you are taking the medicine.  Follow these instructions at home:  Alcohol use  Do not drink alcohol if your health care provider tells you not to drink.  If you drink alcohol:  Limit how much you have to 0-2 drinks a day.  Know how much alcohol is in your drink. In the [place] one drink equals one 12 oz bottle of beer (355 mL), one 5 oz glass of wine (148 mL), or one 1 oz glass of hard liquor (44 mL).  Lifestyle  Do not use any products that contain nicotine or tobacco. These products include cigarettes, chewing tobacco, and vaping devices, such as e-cigarettes. If you need help quitting, ask your health care provider.  Do not use street drugs.  Do not share needles.  Ask your health care provider for help if you need support or information about quitting drugs.  General instructions  Schedule regular health, dental, and eye exams.  Stay current with your vaccines.  Tell your health care provider if:  You often feel depressed.  You have ever been abused or do not feel safe at home.  Summary  Adopting a healthy lifestyle and getting preventive care are important in promoting health and wellness.  Follow your health care provider's instructions about healthy diet, exercising, and getting tested or screened for diseases.  Follow your health care provider's instructions on monitoring your cholesterol and blood pressure.  This information is not intended to replace advice given to you by your health care provider. Make sure you discuss any questions you have with your health care provider.  Document Revised: 02/23/2021 Document Reviewed: 02/23/2021  Elsevier Patient Education  2024 [employer].

## 2024-01-20 LAB — CMP14+EGFR
ALT: 29 IU/L (ref 0–44)
AST: 22 IU/L (ref 0–40)
Albumin: 4.8 g/dL (ref 4.3–5.2)
Alkaline Phosphatase: 66 IU/L (ref 44–121)
BUN/Creatinine Ratio: 13 (ref 9–20)
BUN: 16 mg/dL (ref 6–20)
Bilirubin Total: 0.5 mg/dL (ref 0.0–1.2)
CO2: 20 mmol/L (ref 20–29)
Calcium: 9.9 mg/dL (ref 8.7–10.2)
Chloride: 106 mmol/L (ref 96–106)
Creatinine, Ser: 1.28 mg/dL — ABNORMAL HIGH (ref 0.76–1.27)
Globulin, Total: 2.6 g/dL (ref 1.5–4.5)
Glucose: 160 mg/dL — ABNORMAL HIGH (ref 70–99)
Potassium: 4 mmol/L (ref 3.5–5.2)
Sodium: 142 mmol/L (ref 134–144)
Total Protein: 7.4 g/dL (ref 6.0–8.5)
eGFR: 79 mL/min/{1.73_m2} (ref >59)

## 2024-01-20 LAB — MICROALBUMIN / CREATININE URINE RATIO
Creatinine, Urine: 182.7 mg/dL
Microalb/Creat Ratio: 2 mg/g{creat} (ref 0–29)
Microalbumin, Urine: 3.7 ug/mL

## 2024-01-20 LAB — CBC WITH DIFFERENTIAL/PLATELET
Basophils Absolute: 0 10*3/uL (ref 0.0–0.2)
Basos: 0 %
EOS (ABSOLUTE): 0.1 10*3/uL (ref 0.0–0.4)
Eos: 1 %
Hematocrit: 43.7 % (ref 37.5–51.0)
Hemoglobin: 15.1 g/dL (ref 13.0–17.7)
Immature Grans (Abs): 0 10*3/uL (ref 0.0–0.1)
Immature Granulocytes: 0 %
Lymphocytes Absolute: 1.3 10*3/uL (ref 0.7–3.1)
Lymphs: 20 %
MCH: 32.1 pg (ref 26.6–33.0)
MCHC: 34.6 g/dL (ref 31.5–35.7)
MCV: 93 fL (ref 79–97)
Monocytes Absolute: 0.4 10*3/uL (ref 0.1–0.9)
Monocytes: 6 %
Neutrophils Absolute: 4.9 10*3/uL (ref 1.4–7.0)
Neutrophils: 73 %
Platelets: 222 10*3/uL (ref 150–450)
RBC: 4.7 x10E6/uL (ref 4.14–5.80)
RDW: 12.9 % (ref 11.6–15.4)
WBC: 6.8 10*3/uL (ref 3.4–10.8)

## 2024-01-20 LAB — LIPID PANEL
Cholesterol, Total: 132 mg/dL (ref 100–199)
HDL: 32 mg/dL — ABNORMAL LOW (ref >39)
LDL CALC COMMENT:: 4.1 ratio (ref 0.0–5.0)
LDL Chol Calc (NIH): 55 mg/dL (ref 0–99)
Triglycerides: 287 mg/dL — ABNORMAL HIGH (ref 0–149)
VLDL Cholesterol Cal: 45 mg/dL — ABNORMAL HIGH (ref 5–40)

## 2024-01-20 LAB — VITAMIN D 25 HYDROXY (VIT D DEFICIENCY, FRACTURES): Vit D, 25-Hydroxy: 38.7 ng/mL (ref 30.0–100.0)

## 2024-01-20 LAB — TSH: TSH: 0.844 u[IU]/mL (ref 0.450–4.500)

## 2024-01-20 LAB — VITAMIN B12: Vitamin B-12: 403 pg/mL (ref 232–1245)

## 2024-01-23 ENCOUNTER — Telehealth: Payer: Self-pay

## 2024-01-23 ENCOUNTER — Telehealth: Payer: Self-pay | Admitting: Family Medicine

## 2024-01-23 NOTE — Telephone Encounter (Signed)
 Copied from CRM [id number]. Topic: Clinical - Lab/Test Results >> Jan 23, 2024 10:40 AM Marland Kitchen D wrote: Would like the test results to be explained in more details please call back

## 2024-01-23 NOTE — Telephone Encounter (Signed)
 I spoke to pt's legal guardian and reviewed labs and answered all questions.

## 2024-02-06 ENCOUNTER — Other Ambulatory Visit: Payer: Self-pay | Admitting: Family Medicine

## 2024-02-10 ENCOUNTER — Ambulatory Visit: Payer: Self-pay

## 2024-02-10 NOTE — Telephone Encounter (Signed)
  Chief Complaint: urinary frequency Symptoms: urinary frequency, difficulty starting urine Frequency: ongoing x months Pertinent Negatives: Patient denies pain, blood in urine, fever, flank/back pain, burning with urination, urinary retention. Disposition: [] ED /[] Urgent Care (no appt availability in office) / [x] Appointment(In office/virtual)/ []  [facility]/ [] Home Care/ [] Refused Recommended Disposition /[] [facility]/ []  Follow-up with PCP Additional Notes: Spoke with legal guardian, Allayne Arabian, for triage. He states the patient has been taking oxybutynin  since April 3rd and it makes him sleepy and have dry mouth but is not helping with his bladder frequency. No available appointments with PCP, offered DOD on Monday but patient states he prefers a male provider. Patient scheduled with FNP Gaylyn Keas for Monday afternoon. Advised guardian to go to urgent care of call back if new or worsening symptoms.  Summary: urinary concern / rx req   Copied From CRM [id number]. Reason for Triage: The patient's guardian has called to share that the patient has experienced a significant increase in their urinary frequency recently  The patient is going to the restroom roughly every 40 minutes and would like to be prescribed something to help with their concern         Reason for Disposition  Urinating more frequently than usual (i.e., frequency)  Answer Assessment - Initial Assessment Questions 1. SYMPTOM: "What's the main symptom you're concerned about?" (e.g., frequency, incontinence)     Frequency.  2. ONSET: "When did the  urinary frequency  start?"     Longer than 2 months.  3. PAIN: "Is there any pain?" If Yes, ask: "How bad is it?" (Scale: 1-10; mild, moderate, severe)     Denies.  4. CAUSE: "What do you think is causing the symptoms?"     Unsure.  5. OTHER SYMPTOMS: "Do you have any other symptoms?" (e.g., blood in urine, fever, flank pain, pain with urination)     Hard  for urine to come out sometimes (states it is more than a few drops but just hard to start).  6. PREGNANCY: "Is there any chance you are pregnant?" "When was your last menstrual period?"     N/A.  Protocols used: Urinary Symptoms-A-AH

## 2024-02-10 NOTE — Telephone Encounter (Signed)
 Appointment scheduled. Noted.

## 2024-02-13 ENCOUNTER — Ambulatory Visit (INDEPENDENT_AMBULATORY_CARE_PROVIDER_SITE_OTHER): Payer: MEDICAID | Admitting: Nurse Practitioner

## 2024-02-13 ENCOUNTER — Encounter: Payer: Self-pay | Admitting: Nurse Practitioner

## 2024-02-13 VITALS — BP 124/83 | HR 88 | Temp 97.6°F | Ht 64.0 in | Wt 187.0 lb

## 2024-02-13 DIAGNOSIS — R350 Frequency of micturition: Secondary | ICD-10-CM | POA: Diagnosis not present

## 2024-02-13 LAB — MICROSCOPIC EXAMINATION
Bacteria, UA: NONE SEEN
Epithelial Cells (non renal): NONE SEEN /HPF (ref 0–10)
RBC, Urine: NONE SEEN /HPF (ref 0–2)
Renal Epithel, UA: NONE SEEN /HPF
WBC, UA: NONE SEEN /HPF (ref 0–5)
Yeast, UA: NONE SEEN

## 2024-02-13 LAB — URINALYSIS, COMPLETE
Bilirubin, UA: NEGATIVE
Glucose, UA: NEGATIVE
Ketones, UA: NEGATIVE
Leukocytes,UA: NEGATIVE
Nitrite, UA: NEGATIVE
Protein,UA: NEGATIVE
RBC, UA: NEGATIVE
Specific Gravity, UA: 1.02 (ref 1.005–1.030)
Urobilinogen, Ur: 1 mg/dL (ref 0.2–1.0)
pH, UA: 6.5 (ref 5.0–7.5)

## 2024-02-13 MED ORDER — SOLIFENACIN SUCCINATE 5 MG PO TABS: 5.0000 mg | ORAL_TABLET | Freq: Every day | ORAL | 1 refills | Status: DC

## 2024-02-13 NOTE — Patient Instructions (Signed)
 Solifenacin Suspension What is this medication? SOLIFENACIN (sol i FEN a cin) treats symptoms of an overactive bladder, such as loss of bladder control or frequent need to urinate. It works by relaxing muscles in the bladder. It belongs to a group of medications called antispasmodics. This medicine may be used for other purposes; ask your health care provider or pharmacist if you have questions. COMMON BRAND NAME(S): VESIcare What should I tell my care team before I take this medication? They need to know if you have any of these conditions: Glaucoma History of irregular heartbeat Kidney disease Liver disease Stomach or intestine problems Trouble passing urine An unusual or allergic reaction to solifenacin, other medications, foods, dyes, or preservatives Pregnant or trying to get pregnant Breast-feeding How should I use this medication? Take this medication by mouth with a glass of milk or water. Take it as directed on the prescription label at the same time every day. Shake well before using. Use a specially marked oral syringe, spoon, or dropper to measure each dose. Ask your pharmacist if you do not have one. Household spoons are not accurate. Keep taking it unless your care team tells you to stop. You can take it with or without food. If it upsets your stomach, take it with food. Talk to your care team about the use of this medication in children. While this medication may be prescribed for children as young as 2 years for selected conditions, precautions do apply. Overdosage: If you think you have taken too much of this medicine contact a poison control center or emergency room at once. NOTE: This medicine is only for you. Do not share this medicine with others. What if I miss a dose? If you miss a dose, take it as soon as you can, unless it is more than 12 hours late. If it is more than 12 hours late, skip the missed dose. Take the next dose at the normal time. What may interact with  this medication? Do not take this medication with any of the following: Cisapride Dronedarone Pimozide Thioridazine This medication may also interact with the following: Atropine Certain medications for fungal infections, such as fluconazole, itraconazole, ketoconazole, posaconazole, voriconazole Certain medications for travel sickness, such as scopolamine Certain other medications for bladder problems, such as oxybutynin , tolterodine Other medications that cause heart rhythm changes Rifampin St. John's wort This list may not describe all possible interactions. Give your health care provider a list of all the medicines, herbs, non-prescription drugs, or dietary supplements you use. Also tell them if you smoke, drink alcohol, or use illegal drugs. Some items may interact with your medicine. What should I watch for while using this medication? Visit your care team for regular checks on your progress. Tell your care team if your symptoms do not start to get better or if they get worse. This medication may affect your coordination, reaction time, or judgment. Do not drive or operate machinery until you know how this medication affects you. Sit up or stand slowly to reduce the risk of dizzy or fainting spells. Drinking alcohol with this medication can increase the risk of these side effects. Your mouth may get dry. Chewing sugarless gum or sucking hard candy, and drinking plenty of water may help. Contact your care team if the problem does not go away or is severe. This medication may cause dry eyes and blurred vision. If you wear contact lenses, you may feel some discomfort. Lubricating drops may help. See your care team if the  problem does not go away or is severe. Avoid extreme heat. This medication can cause you to sweat less than normal. Your body temperature could increase to dangerous levels, which may lead to heat stroke. What side effects may I notice from receiving this medication? Side  effects that you should report to your care team as soon as possible: Allergic reactions or angioedema--skin rash, itching or hives, swelling of the face, eyes, lips, tongue, arms, or legs, trouble swallowing or breathing Anticholinergic toxicity--flushed face, blurry vision, dry mouth and skin, confusion, fast or irregular heartbeat, trouble passing urine, constipation Heart rhythm changes--fast or irregular heartbeat, dizziness, feeling faint or lightheaded, chest pain, trouble breathing Side effects that usually do not require medical attention (report to your care team if they continue or are bothersome): Dizziness Drowsiness Dry eyes Headache Nausea Stomach pain This list may not describe all possible side effects. Call your doctor for medical advice about side effects. You may report side effects to FDA at 1-800-FDA-1088. Where should I keep my medication? Keep out of the reach of children and pets. Store at room temperature between 20 and 25 degrees C (68 and 77 degrees F). Get rid of any unused medication 28 days after opening the container. To get rid of medications that are no longer needed or have expired: Take the medication to a medication take-back program. Check with your pharmacy or law enforcement to find a location. If you cannot return the medication, check the label or package insert to see if the medication should be thrown out in the garbage or flushed down the toilet. If you are not sure, ask your care team. If it is safe to put it in the trash, pour the medication out of the container. Mix the medication with cat litter, dirt, coffee grounds, or other unwanted substance. Seal the mixture in a bag or container. Put it in the trash. NOTE: This sheet is a summary. It may not cover all possible information. If you have questions about this medicine, talk to your doctor, pharmacist, or health care provider.  2024 Elsevier/Gold Standard (2021-10-27 00:00:00)

## 2024-02-13 NOTE — Progress Notes (Signed)
   Subjective:    Patient ID: Peter Bradshaw, male    DOB: 1995/11/17, 28 y.o.   MRN: [id number]   Chief Complaint: urinary frequency  Urinary Frequency  Associated symptoms include frequency.    Patient was seen by PCP about 3 months ago, was c/o urinary frequency and was started on oxybutin. He says since starting that he is voiding more. He stopped taking because it was making his mouth dry. Denies dysuria and no fever.  Patient Active Problem List   Diagnosis Date Noted   Vitamin D  deficiency 01/19/2024   Nocturia 01/19/2024   Type 2 diabetes mellitus with hyperglycemia, without long-term current use of insulin (HCC) 01/19/2024   Tachycardia 01/19/2024   Obesity (BMI 35.0-39.9 without comorbidity) 10/20/2021   Difficulty controlling anger 06/05/2021   Diabetes mellitus, new onset (HCC) 01/21/2021   Hypercholesterolemia with hypertriglyceridemia 01/01/2021   Generalized anxiety disorder 07/09/2020   Severe episode of recurrent major depressive disorder, without psychotic features (HCC) 07/09/2020   Autism spectrum disorder        Review of Systems  Constitutional:  Negative for diaphoresis.  Eyes:  Negative for pain.  Respiratory:  Negative for shortness of breath.   Cardiovascular:  Negative for chest pain, palpitations and leg swelling.  Gastrointestinal:  Negative for abdominal pain.  Endocrine: Negative for polydipsia.  Genitourinary:  Positive for frequency.  Skin:  Negative for rash.  Neurological:  Negative for dizziness, weakness and headaches.  Hematological:  Does not bruise/bleed easily.  All other systems reviewed and are negative.      Objective:   Physical Exam Constitutional:      Appearance: Normal appearance. He is obese.  Cardiovascular:     Rate and Rhythm: Normal rate and regular rhythm.     Heart sounds: Normal heart sounds.  Pulmonary:     Effort: Pulmonary effort is normal.     Breath sounds: Normal breath sounds.  Abdominal:      Tenderness: There is no right CVA tenderness or left CVA tenderness.  Skin:    General: Skin is warm.  Neurological:     General: No focal deficit present.     Mental Status: He is alert and oriented to person, place, and time.  Psychiatric:        Mood and Affect: Mood normal.        Behavior: Behavior normal.    BP 124/83   Pulse 88   Temp 97.6 F (36.4 C) (Temporal)   Ht 5\' 4"  (1.626 m)   Wt 187 lb (84.8 kg)   SpO2 99%   BMI 32.10 kg/m    Urine clear     Assessment & Plan:   Peter Bradshaw in today with chief complaint of Urinary Frequency (Has gotten worse since starting oxybutynin /)   1. Urinary frequency (Primary) Need to take meds for at least 3 weeks before will get affect needed - Urinalysis, Complete - Urine Culture - solifenacin (VESICARE) 5 MG tablet; Take 1 tablet (5 mg total) by mouth daily.  Dispense: 90 tablet; Refill: 1    The above assessment and management plan was discussed with the patient. The patient verbalized understanding of and has agreed to the management plan. Patient is aware to call the clinic if symptoms persist or worsen. Patient is aware when to return to the clinic for a follow-up visit. Patient educated on when it is appropriate to go to the emergency department.   Mary-Margaret Gaylyn Keas, FNP '

## 2024-02-15 LAB — URINE CULTURE

## 2024-03-11 ENCOUNTER — Other Ambulatory Visit: Payer: Self-pay | Admitting: Family Medicine

## 2024-04-03 ENCOUNTER — Other Ambulatory Visit: Payer: Self-pay | Admitting: Family Medicine

## 2024-04-15 ENCOUNTER — Other Ambulatory Visit: Payer: Self-pay | Admitting: Family Medicine

## 2024-04-15 DIAGNOSIS — R000 Tachycardia, unspecified: Secondary | ICD-10-CM

## 2024-04-19 ENCOUNTER — Ambulatory Visit: Payer: MEDICAID | Admitting: Family Medicine

## 2024-04-19 ENCOUNTER — Encounter: Payer: Self-pay | Admitting: Family Medicine

## 2024-04-19 VITALS — BP 109/66 | HR 82 | Temp 97.9°F | Ht 64.0 in | Wt 190.6 lb

## 2024-04-19 DIAGNOSIS — E1165 Type 2 diabetes mellitus with hyperglycemia: Secondary | ICD-10-CM

## 2024-04-19 DIAGNOSIS — F411 Generalized anxiety disorder: Secondary | ICD-10-CM

## 2024-04-19 DIAGNOSIS — E1169 Type 2 diabetes mellitus with other specified complication: Secondary | ICD-10-CM | POA: Diagnosis not present

## 2024-04-19 DIAGNOSIS — F332 Major depressive disorder, recurrent severe without psychotic features: Secondary | ICD-10-CM

## 2024-04-19 DIAGNOSIS — E785 Hyperlipidemia, unspecified: Secondary | ICD-10-CM

## 2024-04-19 DIAGNOSIS — R000 Tachycardia, unspecified: Secondary | ICD-10-CM

## 2024-04-19 DIAGNOSIS — F840 Autistic disorder: Secondary | ICD-10-CM

## 2024-04-19 DIAGNOSIS — R519 Headache, unspecified: Secondary | ICD-10-CM | POA: Insufficient documentation

## 2024-04-19 LAB — BAYER DCA HB A1C WAIVED: HB A1C (BAYER DCA - WAIVED): 5.7 % — ABNORMAL HIGH (ref 4.8–5.6)

## 2024-04-19 NOTE — Progress Notes (Signed)
 Established Patient Office Visit  Subjective   Patient ID: Peter Bradshaw, male    DOB: August 20, 1996  Age: 28 y.o. MRN: [id number]  Chief Complaint  Patient presents with   Medical Management of Chronic Issues    HPI  T2DM Diet controlled. Walking some for exercise. Doesn't check blood sugars.   2. Anxiety/depression Declines medication. He is interested in a referral for counseling. He has tried to make some friends but hasn't had success. Seems like he was discharged from his previous counselor due to poor follow up.      04/19/2024    3:30 PM 01/19/2024   10:28 AM 02/21/2023    2:22 PM  Depression screen PHQ 2/9  Decreased Interest 2 3 2   Down, Depressed, Hopeless 1 0 2  PHQ - 2 Score 3 3 4   Altered sleeping 2 0 1  Tired, decreased energy 1 2 2   Change in appetite 0 2 0  Feeling bad or failure about yourself  2 2 2   Trouble concentrating 3 0 3  Moving slowly or fidgety/restless 0 1 0  Suicidal thoughts 0 1 0  PHQ-9 Score 11 11 12   Difficult doing work/chores Somewhat difficult Somewhat difficult Not difficult at all      04/19/2024    3:31 PM 01/19/2024   10:29 AM 02/21/2023    2:22 PM 12/13/2022    3:19 PM  GAD 7 : Generalized Anxiety Score  Nervous, Anxious, on Edge 2 1 1 2   Control/stop worrying 2 2 2 2   Worry too much - different things 2 3 2 2   Trouble relaxing 2 3 3 2   Restless 2 3 2 2   Easily annoyed or irritable 3 2 2 2   Afraid - awful might happen 1 3 1 1   Total GAD 7 Score 14 17 13 13   Anxiety Difficulty Not difficult at all Not difficult at all Not difficult at all Not difficult at all        ROS As per HPI.    Objective:     BP 109/66   Pulse 82   Temp 97.9 F (36.6 C) (Temporal)   Ht 5' 4 (1.626 m)   Wt 190 lb 9.6 oz (86.5 kg)   SpO2 97%   BMI 32.72 kg/m    Physical Exam Vitals and nursing note reviewed.  Constitutional:      General: He is not in acute distress.    Appearance: He is not ill-appearing, toxic-appearing or diaphoretic.   Cardiovascular:     Rate and Rhythm: Normal rate and regular rhythm.     Heart sounds: Normal heart sounds. No murmur heard. Pulmonary:     Effort: No respiratory distress.     Breath sounds: Normal breath sounds. No wheezing.  Musculoskeletal:     Right lower leg: No edema.     Left lower leg: No edema.  Skin:    General: Skin is warm and dry.  Neurological:     Mental Status: He is alert and oriented to person, place, and time. Mental status is at baseline.  Psychiatric:        Mood and Affect: Mood normal.        Behavior: Behavior normal.      No results found for any visits on 04/19/24.    The ASCVD Risk score (Arnett DK, et al., 2019) failed to calculate for the following reasons:   The 2019 ASCVD risk score is only valid for ages 67 to 72  Assessment & Plan:   Type 2 diabetes mellitus with hyperglycemia, without long-term current use of insulin (HCC) A1c 5.7, at goal of <7. On statin. Declines ACE/ARB. Diet controlled.  -     Bayer DCA Hb A1c Waived  Hyperlipidemia associated with type 2 diabetes mellitus (HCC) Last LDL 55. On statin.   Tachycardia Well controlled with propranolol .   Severe episode of recurrent major depressive disorder, without psychotic features (HCC) Denies SI. Referral to psychology placed.  -     Ambulatory referral to Psychology  Generalized anxiety disorder -     Ambulatory referral to Psychology  Autism spectrum disorder -     Ambulatory referral to Psychology     Return in about 6 months (around 10/20/2024) for chronic follow up.  The patient indicates understanding of these issues and agrees with the plan.    Annabella CHRISTELLA Search, FNP

## 2024-04-29 ENCOUNTER — Other Ambulatory Visit: Payer: Self-pay | Admitting: Family Medicine

## 2024-05-01 ENCOUNTER — Other Ambulatory Visit: Payer: Self-pay | Admitting: Family Medicine

## 2024-08-03 ENCOUNTER — Other Ambulatory Visit: Payer: Self-pay | Admitting: Nurse Practitioner

## 2024-08-03 DIAGNOSIS — R350 Frequency of micturition: Secondary | ICD-10-CM

## 2024-10-19 ENCOUNTER — Ambulatory Visit: Payer: MEDICAID | Admitting: Family Medicine

## 2024-10-19 VITALS — BP 120/72 | HR 83 | Temp 98.2°F | Ht 64.0 in | Wt 193.2 lb

## 2024-10-19 DIAGNOSIS — F840 Autistic disorder: Secondary | ICD-10-CM | POA: Diagnosis not present

## 2024-10-19 DIAGNOSIS — E1169 Type 2 diabetes mellitus with other specified complication: Secondary | ICD-10-CM | POA: Diagnosis not present

## 2024-10-19 DIAGNOSIS — E66811 Obesity, class 1: Secondary | ICD-10-CM

## 2024-10-19 DIAGNOSIS — F331 Major depressive disorder, recurrent, moderate: Secondary | ICD-10-CM | POA: Diagnosis not present

## 2024-10-19 DIAGNOSIS — E785 Hyperlipidemia, unspecified: Secondary | ICD-10-CM | POA: Diagnosis not present

## 2024-10-19 DIAGNOSIS — Z599 Problem related to housing and economic circumstances, unspecified: Secondary | ICD-10-CM

## 2024-10-19 DIAGNOSIS — F411 Generalized anxiety disorder: Secondary | ICD-10-CM | POA: Diagnosis not present

## 2024-10-19 DIAGNOSIS — E1165 Type 2 diabetes mellitus with hyperglycemia: Secondary | ICD-10-CM

## 2024-10-19 DIAGNOSIS — R000 Tachycardia, unspecified: Secondary | ICD-10-CM

## 2024-10-19 DIAGNOSIS — R350 Frequency of micturition: Secondary | ICD-10-CM | POA: Diagnosis not present

## 2024-10-19 DIAGNOSIS — Z7984 Long term (current) use of oral hypoglycemic drugs: Secondary | ICD-10-CM | POA: Diagnosis not present

## 2024-10-19 DIAGNOSIS — Z6833 Body mass index (BMI) 33.0-33.9, adult: Secondary | ICD-10-CM | POA: Diagnosis not present

## 2024-10-19 DIAGNOSIS — Z789 Other specified health status: Secondary | ICD-10-CM

## 2024-10-19 DIAGNOSIS — F332 Major depressive disorder, recurrent severe without psychotic features: Secondary | ICD-10-CM

## 2024-10-19 LAB — BAYER DCA HB A1C WAIVED: HB A1C (BAYER DCA - WAIVED): 5.3 % (ref 4.8–5.6)

## 2024-10-19 MED ORDER — PROPRANOLOL HCL ER 80 MG PO CP24: 80.0000 mg | ORAL_CAPSULE | Freq: Every day | ORAL | 3 refills | Status: AC

## 2024-10-19 MED ORDER — METFORMIN HCL ER 500 MG PO TB24: 500.0000 mg | ORAL_TABLET | Freq: Two times a day (BID) | ORAL | 1 refills | Status: AC

## 2024-10-19 MED ORDER — ROSUVASTATIN CALCIUM 10 MG PO TABS: 10.0000 mg | ORAL_TABLET | Freq: Every day | ORAL | 3 refills | Status: AC

## 2024-10-19 MED ORDER — SOLIFENACIN SUCCINATE 5 MG PO TABS: 5.0000 mg | ORAL_TABLET | Freq: Every day | ORAL | 3 refills | Status: AC

## 2024-10-19 NOTE — Progress Notes (Signed)
 "  Established Patient Office Visit  Subjective   Patient ID: Peter Bradshaw, male    DOB: [birth year]  Age: 29 y.o. MRN: [id number]  Chief Complaint  Patient presents with   Medical Management of Chronic Issues    HPI  T2DM Compliant with metformin . Walking some for exercise. Doesn't check blood sugars.   2. Anxiety/depression Declines medication. He has been hanging out at the local coffee shop and has made some friends this way. Isn't sleep well due to frequent awakenings from the dog.   3. SDOH Legal guardian is his only source of transportation. He is interested assistance with other forms of transportation. He also is interested in assistance with finances. He does not have his own bank account or access to a bank account. He has received benefits in the past but reports that he has lost access to them currently as they have the wrong address on file. Reports that guardian is currently assisting with this.      04/19/2024    3:30 PM 01/19/2024   10:28 AM 02/21/2023    2:22 PM  Depression screen PHQ 2/9  Decreased Interest 2 3 2   Down, Depressed, Hopeless 1 0 2  PHQ - 2 Score 3 3 4   Altered sleeping 2 0 1  Tired, decreased energy 1 2 2   Change in appetite 0 2 0  Feeling bad or failure about yourself  2 2 2   Trouble concentrating 3 0 3  Moving slowly or fidgety/restless 0 1 0  Suicidal thoughts 0 1 0  PHQ-9 Score 11  11  12    Difficult doing work/chores Somewhat difficult Somewhat difficult Not difficult at all     Data saved with a previous flowsheet row definition      04/19/2024    3:31 PM 01/19/2024   10:29 AM 02/21/2023    2:22 PM 12/13/2022    3:19 PM  GAD 7 : Generalized Anxiety Score  Nervous, Anxious, on Edge 2 1 1 2   Control/stop worrying 2 2 2 2   Worry too much - different things 2 3 2 2   Trouble relaxing 2 3 3 2   Restless 2 3 2 2   Easily annoyed or irritable 3 2 2 2   Afraid - awful might happen 1 3 1 1   Total GAD 7 Score 14 17 13 13   Anxiety Difficulty Not  difficult at all Not difficult at all Not difficult at all Not difficult at all   ROS As per HPI.    Objective:     BP 120/72   Pulse 83   Temp 98.2 F (36.8 C) (Temporal)   Ht 5' 4 (1.626 m)   Wt 193 lb 3.2 oz (87.6 kg)   SpO2 97%   BMI 33.16 kg/m  Wt Readings from Last 3 Encounters:  10/19/24 193 lb 3.2 oz (87.6 kg)  04/19/24 190 lb 9.6 oz (86.5 kg)  02/13/24 187 lb (84.8 kg)     Physical Exam Vitals and nursing note reviewed.  Constitutional:      General: He is not in acute distress.    Appearance: He is not ill-appearing, toxic-appearing or diaphoretic.  Cardiovascular:     Rate and Rhythm: Normal rate and regular rhythm.     Heart sounds: Normal heart sounds. No murmur heard. Pulmonary:     Effort: No respiratory distress.     Breath sounds: Normal breath sounds. No wheezing.  Musculoskeletal:     Right lower leg: No edema.  Left lower leg: No edema.  Skin:    General: Skin is warm and dry.  Neurological:     Mental Status: He is alert and oriented to person, place, and time. Mental status is at baseline.  Psychiatric:        Mood and Affect: Mood normal.        Behavior: Behavior normal.      Results for orders placed or performed in visit on 10/19/24  Bayer DCA Hb A1c Waived  Result Value Ref Range   HB A1C (BAYER DCA - WAIVED) 5.3 4.8 - 5.6 %      The ASCVD Risk score (Arnett DK, et al., 2019) failed to calculate for the following reasons:   The 2019 ASCVD risk score is only valid for ages 24 to 65   * - Cholesterol units were assumed    Assessment & Plan:   H[place]atha was seen today for medical management of chronic issues.  Diagnoses and all orders for this visit:  Type 2 diabetes mellitus with hyperglycemia, without long-term current use of insulin (HCC) A1c at goal. Continue metformin   -     Bayer DCA Hb A1c Waived -     metFORMIN  (GLUCOPHAGE -XR) 500 MG 24 hr tablet; Take 1 tablet (500 mg total) by mouth 2 (two) times daily with a  meal.  Class 1 obesity due to excess calories with serious comorbidity and body mass index (BMI) of 33.0 to 33.9 in adult Weight trending up. Diet, exercise, weight loss.   Tachycardia Well controlled on current regimen. Denies symptoms.  -     propranolol  ER (INDERAL  LA) 80 MG 24 hr capsule; Take 1 capsule (80 mg total) by mouth daily.  Hyperlipidemia associated with type 2 diabetes mellitus (HCC) Continue statin.  -     rosuvastatin  (CRESTOR ) 10 MG tablet; Take 1 tablet (10 mg total) by mouth daily.  Major depressive disorder, recurrent episode, moderate (HCC) Generalized anxiety disorder Declines treatment currently.   Autism spectrum disorder Declines referral.   Urinary frequency Well controlled on current regimen.  -     solifenacin  (VESICARE ) 5 MG tablet; Take 1 tablet (5 mg total) by mouth daily.  Financial difficulties Difficulty using private transportation Referral to SW discussed and placed.  -     Ambulatory referral to Social Work  Return in about 6 months (around 04/18/2025).  The patient indicates understanding of these issues and agrees with the plan.    Annabella CHRISTELLA Search, FNP "

## 2024-10-19 NOTE — Patient Instructions (Signed)
[facility] [place] [address] - [place] [address] [phone] 

## 2024-10-22 ENCOUNTER — Encounter: Payer: Self-pay | Admitting: Family Medicine

## 2024-10-24 ENCOUNTER — Telehealth: Payer: Self-pay

## 2024-10-24 NOTE — Progress Notes (Signed)
 Complex Care Management Note  Care Guide Note 10/24/2024 Name: Peter Bradshaw MRN: [id number] DOB: 21-May-1996  Peter Bradshaw is a 29 y.o. year old male who sees Joesph Annabella HERO, FNP for primary care. I reached out to Jodie GORMAN [place] by phone today to offer complex care management services.  [name] was given information about Complex Care Management services today including:   The Complex Care Management services include support from the care team which includes your Nurse Care Manager, Clinical Social Worker, or Pharmacist.  The Complex Care Management team is here to help remove barriers to the health concerns and goals most important to you. Complex Care Management services are voluntary, and the patient may decline or stop services at any time by request to their care team member.   Complex Care Management Consent Status: Patient did not agree to participate in complex care management services at this time.  Follow up plan:    Encounter Outcome:  Patient Refused  Jeoffrey [place] , RMA     [facility]  [facility], [facility] Guide  Direct Dial: [phone]  Website: [email or web address]

## 2024-11-09 ENCOUNTER — Other Ambulatory Visit: Payer: Self-pay | Admitting: Family Medicine

## 2025-04-18 ENCOUNTER — Ambulatory Visit: Payer: MEDICAID | Admitting: Family Medicine
